# Patient Record
Sex: Female | Born: 1980 | Race: White | Hispanic: No | Marital: Married | Smoking: Never smoker
Health system: Southern US, Community
[De-identification: ages and names within clinical notes are randomized; demographics above are authoritative.]

## PROBLEM LIST (undated history)

## (undated) DIAGNOSIS — B019 Varicella without complication: Secondary | ICD-10-CM

## (undated) DIAGNOSIS — G43909 Migraine, unspecified, not intractable, without status migrainosus: Secondary | ICD-10-CM

## (undated) DIAGNOSIS — N390 Urinary tract infection, site not specified: Secondary | ICD-10-CM

## (undated) DIAGNOSIS — K5900 Constipation, unspecified: Secondary | ICD-10-CM

## (undated) DIAGNOSIS — J0191 Acute recurrent sinusitis, unspecified: Secondary | ICD-10-CM

## (undated) DIAGNOSIS — T7840XA Allergy, unspecified, initial encounter: Secondary | ICD-10-CM

## (undated) DIAGNOSIS — H6691 Otitis media, unspecified, right ear: Secondary | ICD-10-CM

## (undated) DIAGNOSIS — J40 Bronchitis, not specified as acute or chronic: Secondary | ICD-10-CM

## (undated) DIAGNOSIS — N200 Calculus of kidney: Secondary | ICD-10-CM

## (undated) DIAGNOSIS — Z0000 Encounter for general adult medical examination without abnormal findings: Secondary | ICD-10-CM

## (undated) HISTORY — DX: Urinary tract infection, site not specified: N39.0

## (undated) HISTORY — DX: Varicella without complication: B01.9

## (undated) HISTORY — DX: Otitis media, unspecified, right ear: H66.91

## (undated) HISTORY — DX: Acute recurrent sinusitis, unspecified: J01.91

## (undated) HISTORY — DX: Bronchitis, not specified as acute or chronic: J40

## (undated) HISTORY — DX: Calculus of kidney: N20.0

## (undated) HISTORY — DX: Allergy, unspecified, initial encounter: T78.40XA

## (undated) HISTORY — PX: OTHER SURGICAL HISTORY: SHX169

## (undated) HISTORY — DX: Encounter for general adult medical examination without abnormal findings: Z00.00

## (undated) HISTORY — DX: Migraine, unspecified, not intractable, without status migrainosus: G43.909

## (undated) HISTORY — DX: Constipation, unspecified: K59.00

---

## 2001-04-14 ENCOUNTER — Other Ambulatory Visit: Admission: RE | Admit: 2001-04-14 | Discharge: 2001-04-14 | Payer: Self-pay | Admitting: Family Medicine

## 2002-04-21 ENCOUNTER — Other Ambulatory Visit: Admission: RE | Admit: 2002-04-21 | Discharge: 2002-04-21 | Payer: Self-pay | Admitting: Family Medicine

## 2002-12-12 ENCOUNTER — Other Ambulatory Visit: Admission: RE | Admit: 2002-12-12 | Discharge: 2002-12-12 | Payer: Self-pay | Admitting: Obstetrics and Gynecology

## 2003-06-24 ENCOUNTER — Inpatient Hospital Stay (HOSPITAL_COMMUNITY): Admission: AD | Admit: 2003-06-24 | Discharge: 2003-06-26 | Payer: Self-pay | Admitting: Obstetrics and Gynecology

## 2003-08-07 ENCOUNTER — Other Ambulatory Visit: Admission: RE | Admit: 2003-08-07 | Discharge: 2003-08-07 | Payer: Self-pay | Admitting: Obstetrics and Gynecology

## 2004-06-05 ENCOUNTER — Inpatient Hospital Stay (HOSPITAL_COMMUNITY): Admission: AD | Admit: 2004-06-05 | Discharge: 2004-06-07 | Payer: Self-pay | Admitting: Obstetrics and Gynecology

## 2004-07-03 ENCOUNTER — Encounter: Payer: Self-pay | Admitting: Family Medicine

## 2004-07-03 LAB — CONVERTED CEMR LAB

## 2004-07-10 ENCOUNTER — Other Ambulatory Visit: Admission: RE | Admit: 2004-07-10 | Discharge: 2004-07-10 | Payer: Self-pay | Admitting: Obstetrics and Gynecology

## 2005-04-24 ENCOUNTER — Ambulatory Visit: Payer: Self-pay | Admitting: Family Medicine

## 2005-05-25 ENCOUNTER — Ambulatory Visit: Payer: Self-pay | Admitting: Family Medicine

## 2005-07-06 ENCOUNTER — Ambulatory Visit: Payer: Self-pay | Admitting: Family Medicine

## 2005-07-20 ENCOUNTER — Ambulatory Visit: Payer: Self-pay | Admitting: Family Medicine

## 2005-11-10 DIAGNOSIS — G43909 Migraine, unspecified, not intractable, without status migrainosus: Secondary | ICD-10-CM | POA: Insufficient documentation

## 2005-11-10 DIAGNOSIS — J309 Allergic rhinitis, unspecified: Secondary | ICD-10-CM | POA: Insufficient documentation

## 2005-12-18 ENCOUNTER — Encounter: Payer: Self-pay | Admitting: Family Medicine

## 2007-02-10 ENCOUNTER — Ambulatory Visit: Payer: Self-pay | Admitting: Family Medicine

## 2007-02-10 DIAGNOSIS — R002 Palpitations: Secondary | ICD-10-CM | POA: Insufficient documentation

## 2007-02-10 DIAGNOSIS — L2089 Other atopic dermatitis: Secondary | ICD-10-CM | POA: Insufficient documentation

## 2007-02-11 ENCOUNTER — Encounter: Payer: Self-pay | Admitting: Family Medicine

## 2007-02-11 LAB — CONVERTED CEMR LAB
HCT: 41.7 % (ref 36.0–46.0)
MCV: 91.9 fL (ref 78.0–100.0)
RBC: 4.54 M/uL (ref 3.87–5.11)
WBC: 8.3 10*3/uL (ref 4.0–10.5)

## 2007-02-23 ENCOUNTER — Ambulatory Visit: Payer: Self-pay | Admitting: Cardiology

## 2007-03-01 ENCOUNTER — Encounter: Payer: Self-pay | Admitting: Cardiology

## 2007-03-01 ENCOUNTER — Ambulatory Visit: Payer: Self-pay

## 2007-03-01 ENCOUNTER — Ambulatory Visit: Payer: Self-pay | Admitting: Cardiology

## 2007-03-16 ENCOUNTER — Ambulatory Visit: Payer: Self-pay | Admitting: Cardiology

## 2007-07-18 ENCOUNTER — Ambulatory Visit: Payer: Self-pay | Admitting: Family Medicine

## 2007-07-18 DIAGNOSIS — H6590 Unspecified nonsuppurative otitis media, unspecified ear: Secondary | ICD-10-CM | POA: Insufficient documentation

## 2008-02-06 ENCOUNTER — Ambulatory Visit: Payer: Self-pay | Admitting: Family Medicine

## 2008-02-06 DIAGNOSIS — J0110 Acute frontal sinusitis, unspecified: Secondary | ICD-10-CM | POA: Insufficient documentation

## 2009-01-07 ENCOUNTER — Ambulatory Visit: Payer: Self-pay | Admitting: Family Medicine

## 2009-06-14 ENCOUNTER — Ambulatory Visit: Payer: Self-pay | Admitting: Family Medicine

## 2009-06-14 DIAGNOSIS — J019 Acute sinusitis, unspecified: Secondary | ICD-10-CM

## 2009-06-14 DIAGNOSIS — M7750 Other enthesopathy of unspecified foot: Secondary | ICD-10-CM | POA: Insufficient documentation

## 2009-06-20 ENCOUNTER — Encounter: Payer: Self-pay | Admitting: Family Medicine

## 2009-10-11 ENCOUNTER — Ambulatory Visit: Payer: Self-pay | Admitting: Family Medicine

## 2009-10-11 DIAGNOSIS — J029 Acute pharyngitis, unspecified: Secondary | ICD-10-CM

## 2009-10-22 ENCOUNTER — Telehealth: Payer: Self-pay | Admitting: Family Medicine

## 2010-02-17 ENCOUNTER — Ambulatory Visit
Admission: RE | Admit: 2010-02-17 | Discharge: 2010-02-17 | Payer: Self-pay | Source: Home / Self Care | Attending: Family Medicine | Admitting: Family Medicine

## 2010-02-17 ENCOUNTER — Encounter
Admission: RE | Admit: 2010-02-17 | Discharge: 2010-02-17 | Payer: Self-pay | Source: Home / Self Care | Attending: Family Medicine | Admitting: Family Medicine

## 2010-02-17 DIAGNOSIS — J329 Chronic sinusitis, unspecified: Secondary | ICD-10-CM | POA: Insufficient documentation

## 2010-02-26 ENCOUNTER — Encounter: Payer: Self-pay | Admitting: Family Medicine

## 2010-03-04 NOTE — Progress Notes (Signed)
Summary: Still sick  Phone Note Call from Patient Call back at Home Phone [phone]   Caller: Patient Call For: Seymour Bars DO Summary of Call: Pt called and states she is not any better after taking the med you gave her and wanted to know if could get different med sent in. Still with same symptoms and feels worse Initial call taken by: Kathlene November,  October 22, 2009 1:31 PM  Follow-up for Phone Call        Will try 5 days of Zithromax along with anti histamine - decongestant that she was previously taking.  If not improved in 7 days, will need CT of the sinuses. Follow-up by: Seymour Bars DO,  October 22, 2009 1:38 PM    New/Updated Medications: ZITHROMAX Z-PAK 250 MG TABS (AZITHROMYCIN) take as directed x 5 days Prescriptions: ZITHROMAX Z-PAK 250 MG TABS (AZITHROMYCIN) take as directed x 5 days  #1 pack x 0   Entered and Authorized by:   Seymour Bars DO   Signed by:   Seymour Bars DO on 10/22/2009   Method used:   [facility] to        [facility] [phone][facility] (retail)       [address] [place], [place]  [address]       Ph: [phone] or [phone]       Fax: [phone]   RxID:   [id number]   Appended Document: Still sick 10/22/2009 @ 1:47pm- Pt notified of MD instructions.KJ LPN

## 2010-03-04 NOTE — Assessment & Plan Note (Signed)
Summary: metatarsalgia/ sinusitis   Vital Signs:  Patient profile:   30 year old female Height:      62 inches Weight:      158 pounds BMI:     29.00 O2 Sat:      100 % on Room air Pulse rate:   76 / minute BP sitting:   125 / 76  (left arm) Cuff size:   regular  Vitals Entered By: Payton Spark CMA (Jun 14, 2009 10:22 AM)  O2 Flow:  Room air CC: R foot pain under toes x 3 weeks. No known injury. Also c/o sinus infection/allergies x 2 weeks.    Primary Care Provider:  Seymour Bars DO  CC:  R foot pain under toes x 3 weeks. No known injury. Also c/o sinus infection/allergies x 2 weeks. Marland Kitchen  History of Present Illness: 30 yo WF presents for R foot pain that started Easter Sunday w/o trauma.  She has been having pain under the lateral arch.    She is also having allergies.  She is taking Benadryl but this is not helping.  She has been congested x 2 wks.  She is not blowing much out.  She is having ear pressure and HAs.  No fevers or chills.  She feels really tired.  No GI upset.  No cough or sore throat.    Current Medications (verified): 1)  Maxalt 10 Mg Tabs (Rizatriptan Benzoate) .Marland Kitchen.. 1 Tab By Mouth X 1 As Needed Migraine, Ok To Repeat in 2 Hrs If Needed  Allergies (verified): 1)  ! Pcn  Past History:  Past Medical History: G2P2  Mirena IUD 2006 migraines allergies  Past Surgical History: Reviewed history from 12/18/2005 and no changes required. Tonsillectomy  tympanostomy tubes  Social History: Reviewed history from 01/07/2009 and no changes required. 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  Married to [place].  Nonsmoker.  Has college degree.  No regular exercise.  substitute teaches  Review of Systems      See HPI  Physical Exam  General:  alert, well-developed, well-nourished, and well-hydrated.   Head:  normocephalic and atraumatic.  frontal sinus TTP Eyes:  conjunctiva clear Ears:  bilat TM scaring. R>L sided white layered out effusion Nose:  nasal  congestion with boggy turbinates Mouth:  o/p pink and moist with good dentition and injection Neck:  no masses.   Lungs:  Normal respiratory effort, chest expands symmetrically. Lungs are clear to auscultation, no crackles or wheezes. Heart:  Normal rate and regular rhythm. S1 and S2 normal without gallop, murmur, click, rub or other extra sounds. Extremities:  tender over metatarsal heads R foot on toes 2-4.  with normal foot anatomy; 2+ pedal pulses.  No skin changes.   Neurologic:  Limping toward the L side Skin:  color normal.     Impression & Recommendations:  Problem # 1:  ACUTE SINUSITIS, UNSPECIFIED (ICD-461.9) Treat with Zithromax, Claritin D (for underlying allergies and head congestion) and Advil.  Call if not improved in 10 days. The following medications were removed from the medication list:    Zithromax Z-pak 250 Mg Tabs (Azithromycin) .Marland Kitchen... 2 tabs by mouth x 1 day then 1 tab by mouth daily x 4 days Her updated medication list for this problem includes:    Claritin-d 24 Hour 10-240 Mg Xr24h-tab (Loratadine-pseudoephedrine) .Marland Kitchen... 1 tab by mouth daily    Zithromax Z-pak 250 Mg Tabs (Azithromycin) .Marland Kitchen... 2 tabs by mouth x 1 day then 1 tab by mouth  daily x 4 days  Problem # 2:  METATARSALGIA (ICD-726.70) Treat acute metatarsalgia with RICE.  H/o given.  Elevate foot x 24 hrs, using ice massage, ace wrap and Advil.  Follow with use of gel insoles in shoes, avoid high heals.  Refer to podiatry assuming it is not improving for u/s treatment and orthotics. Orders: Podiatry Referral (Podiatry)  Complete Medication List: 1)  Maxalt 10 Mg Tabs (Rizatriptan benzoate) .Marland Kitchen.. 1 tab by mouth x 1 as needed migraine, ok to repeat in 2 hrs if needed 2)  Claritin-d 24 Hour 10-240 Mg Xr24h-tab (Loratadine-pseudoephedrine) .Marland Kitchen.. 1 tab by mouth daily 3)  Zithromax Z-pak 250 Mg Tabs (Azithromycin) .... 2 tabs by mouth x 1 day then 1 tab by mouth daily x 4 days  Patient Instructions: 1)  Take 5  days of Zithromax for sinusitis. 2)  Use Claritin D once a day for allergies/ congestion and Advil as needed for aches/ pains. 3)  Call if not clearing in 10 days. 4)  For metatarsalgia, ace wrap R foot, elevate and stay off of it for 24 hrs.  Use ice massage on and off 10 min/ time.  Use Gel insoles in shoes.  I will refer to podiatry re: ultrasound treatment and insoles.   Prescriptions: ZITHROMAX Z-PAK 250 MG TABS (AZITHROMYCIN) 2 tabs by mouth x 1 day then 1 tab by mouth daily x 4 days  #1 pack x 0   Entered and Authorized by:   Seymour Bars DO   Signed by:   Seymour Bars DO on 06/14/2009   Method used:   [facility] to        [facility] [phone][facility] (retail)       [address] [place], [place]  [address]       Ph: [phone] or [phone]       Fax: [phone]   RxID:   [id number]

## 2010-03-04 NOTE — Consult Note (Signed)
Summary: [facility]  [facility]   Imported By: Lanelle Bal 07/04/2009 10:17:34  _____________________________________________________________________  External Attachment:    Type:   Image     Comment:   External Document

## 2010-03-04 NOTE — Assessment & Plan Note (Signed)
Summary: sinusitis   Vital Signs:  Patient profile:   30 year old female Height:      62 inches Weight:      155 pounds BMI:     28.45 O2 Sat:      100 % on Room air Temp:     98.3 degrees F oral Pulse rate:   62 / minute BP sitting:   119 / 78  (left arm) Cuff size:   regular  Vitals Entered By: Payton Spark CMA (October 11, 2009 9:29 AM)  O2 Flow:  Room air CC: Ear pain, cough, congestion, ST, facial pressure. Also c/o more frequent HAs.   Primary Care Provider:  Seymour Bars DO  CC:  Ear pain, cough, congestion, ST, and facial pressure. Also c/o more frequent HAs..  History of Present Illness: Roberta Wu is a 30 year-old female with a history of sinus infections.  Today the patient reports that she has been feeling a lot of sinus pressure that started about two weeks ago, but on Monday she began to have sinus drainage, a sore throat,  a non-productive cough and headaches.  She denies any fevers but endorses the chills on a couple of occasions.  She also endorses nausea and two episodes of vomitting on Wedsenday.  She denies any photophobia or recent migrane history or any abdominal pain.  In addition, she denies any dental pain.  Claritin D was helpful until this week when she noticed she was getting worse.  The patient also used cough drops to suppress her cough.    Current Medications (verified): 1)  Maxalt 10 Mg Tabs (Rizatriptan Benzoate) .Marland Kitchen.. 1 Tab By Mouth X 1 As Needed Migraine, Ok To Repeat in 2 Hrs If Needed 2)  Claritin-D 24 Hour 10-240 Mg Xr24h-Tab (Loratadine-Pseudoephedrine) .Marland Kitchen.. 1 Tab By Mouth Daily  Allergies (verified): 1)  ! Pcn  Past History:  Past Medical History: Reviewed history from 06/14/2009 and no changes required. G2P2  Mirena IUD 2006 migraines allergies  Past Surgical History: Reviewed history from 12/18/2005 and no changes required. Tonsillectomy  tympanostomy tubes  Social History: Reviewed history from 01/07/2009 and no  changes required. 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  Married to [place].  Nonsmoker.  Has college degree.  No regular exercise.  substitute teaches  Review of Systems      See HPI  Physical Exam  General:  alert, well-developed, and well-nourished.   Head:  Maxillary sinus sensitive to palpitation. Eyes:  PEERLA, Conjunctivae are clear and the sclrea are white Ears:  The tragus and pinna are both non-painful.  Canals are clear and the tympanic membrane is without inflammation.  There is scarring on the TM. Nose:  No nasal discharge noted.  Some modest erythema Mouth:  good dentition and no exudates.  Mild erythema and post-nasal drip appreciated Neck:  supple and no thyromegaly.   Lungs:  Clear to auscultation with no crackles, rales, rhonchi, or wheezes.  No egophany noted. Heart:  normal rate, regular rhythm, no murmur, no gallop, and no rub.   Skin:  color normal and no rashes.   Cervical Nodes:  shotty submandibular LA Psych:  normally interactive and not anxious appearing.     Impression & Recommendations:  Problem # 1:  ACUTE SINUSITIS, UNSPECIFIED (ICD-461.9)  Rapid STREP neg. Will treat for sinusitis given underlying allergies and 2 wks of head congestion and sinus pressure with Cefdinir x 10 days. Change Claritin D to Allegra D and sample of  Veramyst given to use for the next 2 wks. Call if not improving after 10 days. The following medications were removed from the medication list:    Zithromax Z-pak 250 Mg Tabs (Azithromycin) .Marland Kitchen... 2 tabs by mouth x 1 day then 1 tab by mouth daily x 4 days Her updated medication list for this problem includes:    Cefdinir 300 Mg Caps (Cefdinir) .Marland Kitchen... 1 capsule by mouth two times a day x 10 days    Veramyst 27.5 Mcg/spray Susp (Fluticasone furoate) .Marland Kitchen... 2 sprays per nostril daily    Allegra-d Allergy & Congestion 180-240 Mg Xr24h-tab (Fexofenadine-pseudoephedrine) .Marland Kitchen... 1 tab by mouth qam  Complete Medication List: 1)  Maxalt 10 Mg  Tabs (Rizatriptan benzoate) .Marland Kitchen.. 1 tab by mouth x 1 as needed migraine, ok to repeat in 2 hrs if needed 2)  Cefdinir 300 Mg Caps (Cefdinir) .Marland Kitchen.. 1 capsule by mouth two times a day x 10 days 3)  Veramyst 27.5 Mcg/spray Susp (Fluticasone furoate) .... 2 sprays per nostril daily 4)  Allegra-d Allergy & Congestion 180-240 Mg Xr24h-tab (Fexofenadine-pseudoephedrine) .Marland Kitchen.. 1 tab by mouth qam  Other Orders: Rapid Strep (16[address])  Patient Instructions: 1)  Take Cefdinir 2 x a day x 10 days for sinusitis. 2)  Change Claritin D to Allegra D (OTC) once daily for allergies. 3)  Add vermayst nasal spray 2 sprays per nostril once daily for the next 2 wks. 4)  Call if not improved in 10 days. Prescriptions: CEFDINIR 300 MG CAPS (CEFDINIR) 1 capsule by mouth two times a day x 10 days  #20 x 0   Entered and Authorized by:   Seymour Bars DO   Signed by:   Seymour Bars DO on 10/11/2009   Method used:   [facility]ronically to        [facility] [phone]* (retail)       [address] [place], [place]  [address]       Ph: [phone] or [phone]       Fax: [phone]   RxID:   [id number] MAXALT 10 MG TABS (RIZATRIPTAN BENZOATE) 1 tab by mouth x 1 as needed migraine, OK to repeat in 2 hrs if needed  #12 tabs x 6   Entered and Authorized by:   Seymour Bars DO   Signed by:   Seymour Bars DO on 10/11/2009   Method used:   Electronically to        [facility] [phone]* (retail)       [address] [place], [place]  [address]       Ph: [phone] or [phone]       Fax: [phone]   RxID:   [id number]   Laboratory Results    Other Tests  Rapid Strep: negative

## 2010-03-06 NOTE — Assessment & Plan Note (Signed)
Summary: recurrent sinusitis   Vital Signs:  Patient profile:   30 year old female Height:      62 inches Weight:      157 pounds BMI:     28.82 O2 Sat:      98 % on Room air Temp:     98.2 degrees F oral Pulse rate:   81 / minute BP sitting:   132 / 79  (left arm) Cuff size:   large  Vitals Entered By: Payton Spark CMA (February 17, 2010 10:49 AM)  O2 Flow:  Room air CC: Nasal congestion. ? Sinus infection x 1 week.    Primary Care Provider:  Seymour Bars DO  CC:  Nasal congestion. ? Sinus infection x 1 week. Marland Kitchen  History of Present Illness: 30 yo WF presents for continued nasal congestion.  She has ear pressure and popping, sore throat and months of nasal congestion with sinus pain, pressure and HAs.  She was last treated for a sinus infection in Sept but reports that within a few wks, she was congested again.  She has taken many different anti histamines, decongestants and nasal sprays which do not clear her up.  She has never been allergy tested.  She has some chest tightness, no much cough.  Denies SOB or wheezing.  She feels tired.  She has not been hungry from all the post nasal drip.  Denies F/C or wheezing.  She is not a smoker.  This has been her 4th sinus infection in the past year.    Current Medications (verified): 1)  Maxalt 10 Mg Tabs (Rizatriptan Benzoate) .Marland Kitchen.. 1 Tab By Mouth X 1 As Needed Migraine, Ok To Repeat in 2 Hrs If Needed  Allergies (verified): 1)  ! Pcn 2)  * Cefdinir  Past History:  Past Medical History: Reviewed history from 06/14/2009 and no changes required. G2P2  Mirena IUD 2006 migraines allergies  Past Surgical History: Reviewed history from 12/18/2005 and no changes required. Tonsillectomy  tympanostomy tubes  Social History: Reviewed history from 01/07/2009 and no changes required. 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  Nonsmoker.  Has college degree.  No regular exercise.  substitute teaches  Review of  Systems      See HPI  Physical Exam  General:  alert, well-developed, well-nourished, and well-hydrated.   Head:  normocephalic and atraumatic.  maxillary sinuses TTP Eyes:  conjunctiva clear; allergic shiners present Ears:  EACs patent; TMs translucent and gray with good cone of light and bony landmarks.  with scarring Nose:  nasal congestion present with boggy turbinates Mouth:  o/p mildly injected with clear postnasal drip Neck:  no masses.   Lungs:  Normal respiratory effort, chest expands symmetrically. Lungs are clear to auscultation, no crackles or wheezes. Heart:  Normal rate and regular rhythm. S1 and S2 normal without gallop, murmur, click, rub or other extra sounds. Skin:  color normal.   Cervical Nodes:  enlarged anterior cervical chain LNs   Impression & Recommendations:  Problem # 1:  SINUSITIS, RECURRENT (ICD-473.9) Since she has chronic nasal congestion despite anti histamine - decongestant combos + nasal steroid spray with lack of adeqate response to abx on 4 occasions in the past year, now with another sinus infection, will get CT sinuses today and treat current infection with Bactrim DS x 14 days, + Claritin D and Nasonex.  Time to get her in with ENT and think about allergy shots. The following medications were removed  from the medication list:    Cefdinir 300 Mg Caps (Cefdinir) .Marland Kitchen... 1 capsule by mouth two times a day x 10 days    Veramyst 27.5 Mcg/spray Susp (Fluticasone furoate) .Marland Kitchen... 2 sprays per nostril daily    Allegra-d Allergy & Congestion 180-240 Mg Xr24h-tab (Fexofenadine-pseudoephedrine) .Marland Kitchen... 1 tab by mouth qam    Zithromax Z-pak 250 Mg Tabs (Azithromycin) .Marland Kitchen... Take as directed x 5 days Her updated medication list for this problem includes:    Claritin-d 24 Hour 10-240 Mg Xr24h-tab (Loratadine-pseudoephedrine) .Marland Kitchen... 1 tab by mouth qam    Nasonex 50 Mcg/act Susp (Mometasone furoate) .Marland Kitchen... 2 sprays/ nostril once daily    Smz-tmp Ds 800-160 Mg Tabs  (Sulfamethoxazole-trimethoprim) .Marland Kitchen... 1 tab by mouth two times a day x 14 days  Orders: T-CT Maxillofacial Limited w/o [phone]) ENT Referral (ENT)  Complete Medication List: 1)  Maxalt 10 Mg Tabs (Rizatriptan benzoate) .Marland Kitchen.. 1 tab by mouth x 1 as needed migraine, ok to repeat in 2 hrs if needed 2)  Claritin-d 24 Hour 10-240 Mg Xr24h-tab (Loratadine-pseudoephedrine) .Marland Kitchen.. 1 tab by mouth qam 3)  Nasonex 50 Mcg/act Susp (Mometasone furoate) .... 2 sprays/ nostril once daily 4)  Smz-tmp Ds 800-160 Mg Tabs (Sulfamethoxazole-trimethoprim) .Marland Kitchen.. 1 tab by mouth two times a day x 14 days  Patient Instructions: 1)  Treat current sinus infection with Bactrim DS- 1 tab with breakfast and dinner for 2 wks. 2)  Will get CT sinuses today and I will call you w/ results tomorrow. 3)  ENT referral will be made for further evaluation. 4)  For underlying allergies, stay on Claritin D + Nasonex daily. 5)  Call me if any problems. Prescriptions: SMZ-TMP DS 800-160 MG TABS (SULFAMETHOXAZOLE-TRIMETHOPRIM) 1 tab by mouth two times a day x 14 days  #28 x 0   Entered and Authorized by:   Seymour Bars DO   Signed by:   Seymour Bars DO on 02/17/2010   Method used:   Electronically to        [facility] [phone]* (retail)       [address] [place], [place]  [address]       Ph: [phone] or [phone]       Fax: [phone]   RxID:   [id number] NASONEX 50 MCG/ACT SUSP (MOMETASONE FUROATE) 2 sprays/ nostril once daily  #1 bottle x 1   Entered and Authorized by:   Seymour Bars DO   Signed by:   Seymour Bars DO on 02/17/2010   Method used:   Electronically to        [facility] [phone]* (retail)       [address] [place], [place]  [address]       Ph: [phone] or [phone]       Fax: [phone]   RxID:   [id number]    Orders Added: 1)  T-CT Maxillofacial Limited w/o [73710] 2)  ENT Referral [ENT] 3)  Est. Patient Level III [62694]

## 2010-03-26 NOTE — Letter (Signed)
Summary: [facility]   Imported By: Kassie Mends 03/20/2010 08:46:24  _____________________________________________________________________  External Attachment:    Type:   Image     Comment:   External Document

## 2010-06-17 NOTE — Assessment & Plan Note (Signed)
[facility]                            CARDIOLOGY OFFICE NOTE   NAME:Roberta Wu, Roberta Wu                        MRN:          [id number]  DATE:03/16/2007                            DOB:          May 18, 1980    Roberta Wu is a 30 year old female whom I recently saw on February 23, 2007 secondary to dyspnea, chest tightness, and palpitations.  When I  saw her, we schedule her to have an echocardiogram which was performed  on March 01, 2007.  Her LV function was normal.  There were no  significant valvular abnormalities noted.  Her D-dimer was also normal.  She also had a monitor performed that showed sinus rhythm to sinus  tachycardia.  Since I saw her before, she continues to have some dyspnea  on exertion.  There is no orthopnea, PND, or pedal edema.  She also has  chest tightness.  This occurs predominantly at night with lying down.  It increases with inspiration, but it also improves with sitting up.  She particularly notices this on Sundays when she eats late at night.  Note there is no associated water brash.  Her palpitations have improved  since I saw her before.   MEDICATIONS:  None at present other than p.r.n. medications.   PHYSICAL EXAMINATION:  VITAL SIGNS:  Blood pressure of 127/79, and pulse  is 86.  She weighs 151 pounds.  HEENT:  Normal.  NECK:  Supple, with normal upstroke.  CHEST:  Clear.  CARDIOVASCULAR:  Regular rate and rhythm.  ABDOMEN:  Shows no tenderness.  EXTREMITIES:  Show no edema.   DIAGNOSES:  1. Chest tightness - Her symptoms do not sound cardiac.  They could      potentially be reflux as they occur predominantly at night and in      particular after large meals.  I have asked her to follow up with      Dr. Cathey Endow, and she may need a GI consult in the future.  She may      also require Protonix or Nexium.  2. Palpitations - These are much improved.  Her monitor showed sinus      rhythm to sinus tachycardia.  If they  worsen in the future, we can      consider a low-dose beta blocker.  3. Dyspnea - The etiology of this is unclear at present.  Note her D-      dimer was normal, and her LV function is normal.  She is not volume      overloaded on exam.  I have asked to follow up with Dr. Cathey Endow      concerning this issue.  4. History of migraines.   We will see her back on an as-needed basis.     Madolyn Frieze Jens Som, MD, [facility]  Electronically Signed    BSC/MedQ  DD: 03/16/2007  DT: 03/18/2007  Job #: [id number]   cc:   Seymour Bars, D.O.

## 2010-06-17 NOTE — Assessment & Plan Note (Signed)
[facility]                            CARDIOLOGY OFFICE NOTE   NAME:Calamia, NICKIA BOESEN                        MRN:          [id number]  DATE:02/23/2007                            DOB:          10/09/1980    Mrs. Roberta Wu is a very pleasant 30 year old female who presents for  evaluation of dyspnea and chest and palpitations.  She has no prior  cardiac history.  She typically does not have dyspnea on exertion,  orthopnea, PND, pedal edema, palpitations, pre-syncope, syncope or  exertional chest pain.  She does state that for the past several months  she has had occasional palpitations.  They are sudden in onset and  described as her heart racing.  There is no associated chest pain or  dyspnea.  These typically do not make her feel lightheaded, but she had  one approximately 2 weeks ago that was more sustained.  When she stood  to stand, she felt like she was going to pass out.  She was seen by  Dr. Cathey Endow and we were asked to further evaluate.  Note, she has also  noticed increased dyspnea on exertion recently.  There is no recent  travel or leg trauma. Note, she denies no orthopnea, PND or pedal edema.  She does not have exertional chest pain, but she can occasionally feel  tight in the chest.   CURRENT MEDICATIONS:  1. Allegra D as needed.  2. Maxalt as needed.  3. Triamcinolone cream.  She is not taking Allegra-D or Maxalt since seeing Dr. Cathey Endow.   SHE HAS AN ALLERGY TO PENICILLIN WHICH CAUSES A RASH.   FAMILY HISTORY:  Is negative for coronary artery disease, arrhythmia or  sudden death.   SOCIAL HISTORY:  She does not smoke nor is she consume alcohol.   PAST MEDICAL HISTORY:  There is no diabetes mellitus, hypertension or  hyperlipidemia.  She has had a previous tubes in her ears as well as  tonsillectomy.   REVIEW OF SYSTEMS:  She occasionally has migraines, but is not having  headaches at present.  There is no fevers, chills, productive  cough.  There is no hemoptysis.  There is no dysphagia, odynophagia, melena or  hematochezia. There is no dysuria, hematuria. There is no rash or  seizure activity. There is no orthopnea, PND or pedal edema.  Her  menstrual cycles are normal.  There is no claudication.  She has had  some pain in the right lower extremity at times.   PHYSICAL EXAMINATION:  Today shows a blood pressure of 129/82 and pulse  is 92.  She weighs 151 pounds.  She is well-developed, well-nourished.  She does not appear to be in any acute distress.  SKIN:  Warm and dry.  She does not appear to be depressed.  There is no peripheral clubbing.  Her back is normal.  HEENT:  Normal with normal eyelids.  NECK:  Supple with normal upstroke bilaterally.  No bruits noted.  There  is no jugular venous distention and no thyromegaly is noted.  Chest is clear to auscultation.  Normal expansion.  CARDIOVASCULAR:  Regular rate and rhythm.  Normal S1-S2.  There are no  murmurs, rubs or gallops noted.  There is no change Valsalva.  ABDOMEN: Exam nontender.  Positive bowel sounds.  No hepatosplenomegaly.  No mass appreciated.  There is no abdominal bruit.  She has 2+ femoral pulses bilaterally.  No bruits.  EXTREMITIES:  Show no edema.  I can palpate no cords.  She has a  negative Homans' bilaterally.  She has 2+ dorsalis pedis pulses  bilaterally.  NEUROLOGIC:  Examination grossly intact.   I do have electrocardiogram from February 10, 2007 taken Dr. Ovidio Kin  office.  She had a sinus rhythm.  The axis is normal.  There are no ST  changes.  There is no delta wave noted.  Her intervals are normal.   DIAGNOSES:  1. Palpitations - Etiology of this is unclear at present.      Electrocardiogram is normal.  Some of her symptoms sound like they      could be in SVT.  We will schedule her to have an event monitor to      more fully evaluate.  Also schedule her to have an echocardiogram      to quantify her left ventricular  function.  2. Dyspnea on exertion - She is not volume overloaded on exam.  I      doubt pulmonary embolus, but we will check a D-dimer to exclude.      The echocardiogram will help [place] quantify left ventricle function.  3. History of migraines - If her workup above is negative, then we      will resume her Maxalt.   I will see back in 4-6 weeks to review her event monitor.     Madolyn Frieze Jens Som, MD, [facility]  Electronically Signed    BSC/MedQ  DD: 02/23/2007  DT: 02/23/2007  Job #: [id number]   cc:   Seymour Bars, D.O.

## 2010-10-22 ENCOUNTER — Encounter: Payer: Self-pay | Admitting: Family Medicine

## 2010-10-22 ENCOUNTER — Ambulatory Visit (INDEPENDENT_AMBULATORY_CARE_PROVIDER_SITE_OTHER): Payer: 59 | Admitting: Family Medicine

## 2010-10-22 VITALS — BP 121/83 | HR 73 | Temp 98.2°F | Ht 62.0 in | Wt 152.0 lb

## 2010-10-22 DIAGNOSIS — J0191 Acute recurrent sinusitis, unspecified: Secondary | ICD-10-CM | POA: Insufficient documentation

## 2010-10-22 DIAGNOSIS — Z0000 Encounter for general adult medical examination without abnormal findings: Secondary | ICD-10-CM

## 2010-10-22 DIAGNOSIS — Z889 Allergy status to unspecified drugs, medicaments and biological substances status: Secondary | ICD-10-CM

## 2010-10-22 DIAGNOSIS — J019 Acute sinusitis, unspecified: Secondary | ICD-10-CM

## 2010-10-22 DIAGNOSIS — J329 Chronic sinusitis, unspecified: Secondary | ICD-10-CM

## 2010-10-22 DIAGNOSIS — J309 Allergic rhinitis, unspecified: Secondary | ICD-10-CM

## 2010-10-22 DIAGNOSIS — H6691 Otitis media, unspecified, right ear: Secondary | ICD-10-CM | POA: Insufficient documentation

## 2010-10-22 HISTORY — DX: Encounter for general adult medical examination without abnormal findings: Z00.00

## 2010-10-22 MED ORDER — CETIRIZINE HCL 10 MG PO TABS: ORAL_TABLET | ORAL | Status: DC

## 2010-10-22 MED ORDER — FLUTICASONE PROPIONATE 50 MCG/ACT NA SUSP: 2.0000 | Freq: Every day | NASAL | Status: DC

## 2010-10-22 MED ORDER — AZITHROMYCIN 250 MG PO TABS: ORAL_TABLET | ORAL | Status: AC

## 2010-10-22 MED ORDER — GUAIFENESIN ER 600 MG PO TB12: 600.0000 mg | ORAL_TABLET | Freq: Two times a day (BID) | ORAL | Status: DC

## 2010-10-22 NOTE — Patient Instructions (Signed)

## 2010-10-22 NOTE — Assessment & Plan Note (Signed)
Azithromycin, Mucinex and increase fluids, call if symptoms do not resolve

## 2010-10-22 NOTE — Assessment & Plan Note (Signed)
Restart Cetirizine 10 mg daily and may increase to bid on a bad day, start Fluticasone 1 spray each nostril daily as well. Consider allergy testing if symptoms worsen.

## 2010-10-22 NOTE — Assessment & Plan Note (Signed)
Patient declines flu shot today, declines labs soon for preventative health purposes now but agrees to return for annual exam and labs prior to that in 9-12 months or sooner as needed

## 2010-10-22 NOTE — Progress Notes (Signed)
Roberta Wu [id number] 01-20-1981 10/22/2010      Progress Note New Patient  Subjective  Chief Complaint  Chief Complaint  Patient presents with  . Establish Care    new patient  . Sinusitis    HPI  Patient is a 30 year old Caucasian female who is in today for an urgent inpatient appointment. She is a long history of otitis media dating back to childhood. As an adult she developed a recurrent allergies and sinusitis. Previously she was maintained on cetirizine and Nasonex and went quite some time without a sinus infection. Presently she is not taking either one and over the last week has been struggling with low-grade fevers and chills increased facial pressure, sore throat, dry cough, nasal congestion and right ear pain. She has a feeling like her left ear is clogged as well. She is presently working as a Runner, broadcasting/film/video and is uncomfortable enough to make work difficult. No chest pain, palpitations, shortness of breath, GI or GU complaints noted at today's visit.  Past Medical History  Diagnosis Date  . Chicken pox as a child  . Migraines   . Allergy   . Otitis media of right ear   . Recurrent acute sinusitis   . Preventative health care 10/22/2010    Past Surgical History  Procedure Date  . Tubes in ears   . Adnoids     Family History  Problem Relation Age of Onset  . Migraines Mother   . Migraines Brother   . Dementia Maternal Grandmother   . Hypertension Maternal Grandmother   . Hyperlipidemia Maternal Grandmother   . Cancer Paternal Grandfather     lymphoma/smoker  . Stroke Maternal Grandfather   . Depression Paternal Grandmother     History   Social History  . Marital Status: Married    Spouse Name: N/A    Number of Children: N/A  . Years of Education: N/A   Occupational History  . Not on file.   Social History Main Topics  . Smoking status: Never Smoker   . Smokeless tobacco: Never Used  . Alcohol Use: No  . Drug Use: No  . Sexually Active: Yes    Other Topics Concern  . Not on file   Social History Narrative  . No narrative on file    No current outpatient prescriptions on file prior to visit.    Allergies  Allergen Reactions  . Cefdinir     REACTION: GI SEs  . Latex   . Penicillins     REACTION: hives    Review of Systems  Review of Systems  Constitutional: Positive for fever and chills. Negative for malaise/fatigue.  HENT: Positive for ear pain, congestion and sore throat. Negative for hearing loss and nosebleeds.   Eyes: Negative for pain and discharge.  Respiratory: Positive for cough. Negative for sputum production, shortness of breath and wheezing.   Cardiovascular: Negative for chest pain, palpitations and leg swelling.  Gastrointestinal: Negative for heartburn, nausea, vomiting, abdominal pain, diarrhea, constipation and blood in stool.  Genitourinary: Negative for dysuria, urgency, frequency and hematuria.  Musculoskeletal: Negative for myalgias, back pain and falls.  Skin: Negative for rash.  Neurological: Negative for dizziness, tremors, sensory change, focal weakness, loss of consciousness, weakness and headaches.  Endo/Heme/Allergies: Negative for polydipsia. Does not bruise/bleed easily.  Psychiatric/Behavioral: Negative for depression and suicidal ideas. The patient is not nervous/anxious and does not have insomnia.     Objective  BP 121/83  Pulse 73  Temp(Src) 98.2 F (36.8  C) (Oral)  Ht 5\' 2"  (1.575 m)  Wt 152 lb (68.947 kg)  BMI 27.80 kg/m2  SpO2 99%  LMP 09/21/2010  Physical Exam  Physical Exam  Constitutional: She is oriented to person, place, and time and well-developed, well-nourished, and in no distress. No distress.  HENT:  Head: Normocephalic and atraumatic.  Right Ear: External ear normal.  Left Ear: External ear normal.  Nose: Nose normal.  Mouth/Throat: Oropharynx is clear and moist. No oropharyngeal exudate.       Scarring noted on b/l TMs, TMs dull not erythematous   Eyes: Conjunctivae are normal. Pupils are equal, round, and reactive to light. Right eye exhibits no discharge. Left eye exhibits no discharge. No scleral icterus.  Neck: Normal range of motion. Neck supple. No thyromegaly present.  Cardiovascular: Normal rate, regular rhythm, normal heart sounds and intact distal pulses.   No murmur heard. Pulmonary/Chest: Effort normal and breath sounds normal. No respiratory distress. She has no wheezes. She has no rales.  Abdominal: Soft. Bowel sounds are normal. She exhibits no distension and no mass. There is no tenderness.  Musculoskeletal: Normal range of motion. She exhibits no edema and no tenderness.  Lymphadenopathy:    She has no cervical adenopathy.  Neurological: She is alert and oriented to person, place, and time. She has normal reflexes. No cranial nerve deficit. Coordination normal.  Skin: Skin is warm and dry. No rash noted. She is not diaphoretic.  Psychiatric: Mood, memory and affect normal.       Assessment & Plan  Recurrent acute sinusitis Azithromycin, Mucinex and increase fluids, call if symptoms do not resolve  RHINITIS, ALLERGIC Restart Cetirizine 10 mg daily and may increase to bid on a bad day, start Fluticasone 1 spray each nostril daily as well. Consider allergy testing if symptoms worsen.  Preventative health care Patient declines flu shot today, declines labs soon for preventative health purposes now but agrees to return for annual exam and labs prior to that in 9-12 months or sooner as needed

## 2011-02-27 ENCOUNTER — Encounter: Payer: Self-pay | Admitting: Family Medicine

## 2011-02-27 ENCOUNTER — Ambulatory Visit (INDEPENDENT_AMBULATORY_CARE_PROVIDER_SITE_OTHER): Payer: 59 | Admitting: Family Medicine

## 2011-02-27 DIAGNOSIS — J40 Bronchitis, not specified as acute or chronic: Secondary | ICD-10-CM

## 2011-02-27 DIAGNOSIS — R05 Cough: Secondary | ICD-10-CM

## 2011-02-27 DIAGNOSIS — J329 Chronic sinusitis, unspecified: Secondary | ICD-10-CM

## 2011-02-27 DIAGNOSIS — J309 Allergic rhinitis, unspecified: Secondary | ICD-10-CM

## 2011-02-27 MED ORDER — ACETAMINOPHEN-CODEINE #3 300-30 MG PO TABS: 1.0000 | ORAL_TABLET | ORAL | Status: AC | PRN

## 2011-02-27 MED ORDER — AZITHROMYCIN 250 MG PO TABS: ORAL_TABLET | ORAL | Status: AC

## 2011-02-27 NOTE — Patient Instructions (Signed)

## 2011-03-01 ENCOUNTER — Encounter: Payer: Self-pay | Admitting: Family Medicine

## 2011-03-01 DIAGNOSIS — J40 Bronchitis, not specified as acute or chronic: Secondary | ICD-10-CM

## 2011-03-01 HISTORY — DX: Bronchitis, not specified as acute or chronic: J40

## 2011-03-01 NOTE — Assessment & Plan Note (Addendum)
Course of Azithromycin. Has to take Mucinex twice a day. Given Tylenol with codeine to use for cough suppression and pain. Push clear fluids and increase rest

## 2011-03-01 NOTE — Assessment & Plan Note (Signed)
Encouraged Fluticasone and antihistamines daily

## 2011-03-01 NOTE — Progress Notes (Signed)
Patient ID: Roberta Wu, female   DOB: 1980/10/25, 31 y.o.   MRN: [id number] SORAIDA VICKERS [id number] 1980/12/19 03/01/2011      Progress Note-Follow Up  Subjective  Chief Complaint  Chief Complaint  Patient presents with  . Cough    w/ phlegm (yellowish) until she throws up X 8 days  . Headache  . ears popping    HPI  Patient is a 31 year old Caucasian female who is in today 8 days worth of worsening cough productive of yellow phlegm. Assessment headache and pressure and popping in her ears. Some intermittent chest pain with coughing and occasional sensation of racing heart and even some shortness of breath with exertion. No obvious fevers and chills she has had some anorexia and fatigue. No GI or GU complaints. Cough is excessive enough to cause posttussive vomiting times per  Past Medical History  Diagnosis Date  . Chicken pox as a child  . Migraines   . Allergy   . Otitis media of right ear   . Recurrent acute sinusitis   . Preventative health care 10/22/2010  . Bronchitis 03/01/2011    Past Surgical History  Procedure Date  . Tubes in ears   . Adnoids     Family History  Problem Relation Age of Onset  . Migraines Mother   . Migraines Brother   . Dementia Maternal Grandmother   . Hypertension Maternal Grandmother   . Hyperlipidemia Maternal Grandmother   . Cancer Paternal Grandfather     lymphoma/smoker  . Stroke Maternal Grandfather   . Depression Paternal Grandmother     History   Social History  . Marital Status: Married    Spouse Name: N/A    Number of Children: N/A  . Years of Education: N/A   Occupational History  . Not on file.   Social History Main Topics  . Smoking status: Never Smoker   . Smokeless tobacco: Never Used  . Alcohol Use: No  . Drug Use: No  . Sexually Active: Yes   Other Topics Concern  . Not on file   Social History Narrative  . No narrative on file    Current Outpatient Prescriptions on File Prior to Visit    Medication Sig Dispense Refill  . cetirizine (ZYRTEC) 10 MG tablet 1 tab po daily and a second dose daily if symptoms flare  60 tablet  3  . fluticasone (FLONASE) 50 MCG/ACT nasal spray Place 2 sprays into the nose daily.  16 g  12  . guaiFENesin (MUCINEX) 600 MG 12 hr tablet Take 1 tablet (600 mg total) by mouth 2 (two) times daily.  20 tablet  1    Allergies  Allergen Reactions  . Cefdinir     REACTION: GI SEs  . Latex   . Penicillins     REACTION: hives    Review of Systems  Review of Systems  Constitutional: Positive for malaise/fatigue. Negative for fever and chills.  HENT: Positive for nosebleeds and congestion.   Eyes: Negative for discharge.  Respiratory: Positive for cough and shortness of breath.   Cardiovascular: Positive for chest pain and palpitations. Negative for leg swelling.  Gastrointestinal: Negative for nausea, abdominal pain, diarrhea and constipation.  Genitourinary: Negative for dysuria.  Musculoskeletal: Negative for falls.  Skin: Negative for rash.  Neurological: Negative for loss of consciousness and headaches.  Endo/Heme/Allergies: Negative for polydipsia.  Psychiatric/Behavioral: Negative for depression and suicidal ideas. The patient is not nervous/anxious and does not have insomnia.  Objective  BP 129/87  Pulse 70  Temp(Src) 98.8 F (37.1 C) (Temporal)  Ht 5\' 2"  (1.575 m)  Wt 154 lb 6.4 oz (70.035 kg)  BMI 28.24 kg/m2  SpO2 98%  LMP 02/06/2011  Physical Exam  Physical Exam  Constitutional: She is oriented to person, place, and time and well-developed, well-nourished, and in no distress. No distress.  HENT:  Head: Normocephalic and atraumatic.  Eyes: Conjunctivae are normal.  Neck: Neck supple. No thyromegaly present.  Cardiovascular: Normal rate, regular rhythm and normal heart sounds.   No murmur heard. Pulmonary/Chest: Effort normal. She has no wheezes. She has rales.       Slight, b/l bases  Abdominal: She exhibits no  distension and no mass.  Musculoskeletal: She exhibits no edema.  Lymphadenopathy:    She has no cervical adenopathy.  Neurological: She is alert and oriented to person, place, and time.  Skin: Skin is warm and dry. No rash noted. She is not diaphoretic.  Psychiatric: Memory, affect and judgment normal.    Lab Results  Component Value Date   TSH 1.388 02/11/2007   Lab Results  Component Value Date   WBC 8.3 02/11/2007   HGB 14.0 02/11/2007   HCT 41.7 02/11/2007   MCV 91.9 02/11/2007   PLT 312 02/11/2007      Assessment & Plan  Bronchitis Course of Azithromycin. Has to take Mucinex twice a day. Given Tylenol with codeine to use for cough suppression and pain. Push clear fluids and increase rest  RHINITIS, ALLERGIC Encouraged Fluticasone and antihistamines daily

## 2011-05-07 ENCOUNTER — Ambulatory Visit (INDEPENDENT_AMBULATORY_CARE_PROVIDER_SITE_OTHER): Payer: BC Managed Care – PPO | Admitting: Family Medicine

## 2011-05-07 ENCOUNTER — Encounter: Payer: Self-pay | Admitting: Family Medicine

## 2011-05-07 VITALS — BP 120/82 | HR 63 | Temp 99.3°F | Ht 62.0 in | Wt 159.0 lb

## 2011-05-07 DIAGNOSIS — J310 Chronic rhinitis: Secondary | ICD-10-CM

## 2011-05-07 DIAGNOSIS — J019 Acute sinusitis, unspecified: Secondary | ICD-10-CM | POA: Insufficient documentation

## 2011-05-07 MED ORDER — FEXOFENADINE HCL 180 MG PO TABS: 180.0000 mg | ORAL_TABLET | Freq: Every day | ORAL | Status: DC

## 2011-05-07 MED ORDER — DOXYCYCLINE HYCLATE 100 MG PO CAPS: 100.0000 mg | ORAL_CAPSULE | Freq: Two times a day (BID) | ORAL | Status: DC

## 2011-05-07 MED ORDER — FLUNISOLIDE 25 MCG/ACT (0.025%) NA SOLN: NASAL | Status: AC

## 2011-05-07 NOTE — Assessment & Plan Note (Signed)
Sx's not well controlled, seem to be year round, no change inside vs outside per pt. We'll switch from zyrtec to Allegra 180mg  qd and from flonase to flunisolide 2 sprays bid, encouraged bid nasal saline irrigation. Will do RAST allergy labs today.

## 2011-05-07 NOTE — Assessment & Plan Note (Signed)
Not clearly bacterial.  Her right ear appearance is such that I cannot rule out mild AOM, although there are extensive chronic changes on both TMs.  Will go ahead and do 10 day course of doxycycline 100mg  bid.

## 2011-05-07 NOTE — Progress Notes (Signed)
OFFICE NOTE  05/07/2011  CC:  Chief Complaint  Patient presents with  . Sinusitis    since Friday     HPI: Patient is a 31 y.o. Caucasian female who is here for resp illness. Pt presents complaining of respiratory symptoms for 6  days.  Mostly nasal congestion/runny nose, sneezing, and PND cough, some HA.  Worst symptoms seems to be the nose stuffy/runny--thick.  Lately the symptoms seem to be worsening--ears hurting/stopped up. No fevers, no wheezing, and no SOB.  No pain in face or teeth.  No significant HA.  ST mild at most.  Symptoms made worse by nothing.  Symptoms improved by nothing. Smoker? no Recent sick contact? no Muscle or joint aches? no Flu shot this season at least 2 wks ago? no  ROS: no v/d or abdominal pain, but nausea the first 3 days (related to migraine).  No rash.  No neck stiffness.   +Mild fatigue.  +Mild appetite loss.   Pertinent PMH:  Past Medical History  Diagnosis Date  . Chicken pox as a child  . Migraines   . Allergy   . Otitis media of right ear   . Recurrent acute sinusitis   . Preventative health care 10/22/2010  . Bronchitis 03/01/2011  Has never been allergy tested  MEDS:  Outpatient Prescriptions Prior to Visit  Medication Sig Dispense Refill  . cetirizine (ZYRTEC) 10 MG tablet 1 tab po daily and a second dose daily if symptoms flare  60 tablet  3  . fluticasone (FLONASE) 50 MCG/ACT nasal spray Place 2 sprays into the nose daily.  16 g  12  . guaiFENesin (MUCINEX) 600 MG 12 hr tablet Take 1 tablet (600 mg total) by mouth 2 (two) times daily.  20 tablet  1    PE: Blood pressure 120/82, pulse 63, temperature 99.3 F (37.4 C), temperature source Temporal, height 5\' 2"  (1.575 m), weight 159 lb (72.122 kg). VS: noted--normal. Gen: alert, NAD, NONTOXIC APPEARING. HEENT: eyes without injection, drainage, or swelling.  Ears: EACs clear, TMs with normal light reflex and landmarks.  Nose: Clear rhinorrhea, with some dried, crusty exudate  adherent to mildly injected mucosa.  No purulent d/c.  No paranasal sinus TTP.  No facial swelling.  Throat and mouth without focal lesion.  No pharyngial swelling, erythema, or exudate.   Neck: supple, no LAD.   LUNGS: CTA bilat, nonlabored resps.   CV: RRR, no m/r/g. EXT: no c/c/e SKIN: no rash    IMPRESSION AND PLAN:  Chronic rhinitis Sx's not well controlled, seem to be year round, no change inside vs outside per pt. We'll switch from zyrtec to Allegra 180mg  qd and from flonase to flunisolide 2 sprays bid, encouraged bid nasal saline irrigation. Will do RAST allergy labs today.  Sinusitis acute Not clearly bacterial.  Her right ear appearance is such that I cannot rule out mild AOM, although there are extensive chronic changes on both TMs.  Will go ahead and do 10 day course of doxycycline 100mg  bid.      FOLLOW UP: 3 wks

## 2011-05-08 LAB — ALLERGEN FOOD PROFILE SPECIFIC IGE
Apple: <0.1 kU/L (ref <0.35)
Chicken IgE: <0.1 kU/L (ref <0.35)
Corn: <0.1 kU/L (ref <0.35)
Fish Cod: <0.1 kU/L (ref <0.35)
IgE (Immunoglobulin E), Serum: 3 IU/mL (ref 0.0–180.0)
Milk IgE: <0.1 kU/L (ref <0.35)
Tuna IgE: <0.1 kU/L (ref <0.35)
Wheat IgE: <0.1 kU/L (ref <0.35)

## 2011-05-08 LAB — ~~LOC~~ ALLERGY PANEL
Allergen, Comm Silver Birch, t9: <0.1 kU/L (ref <0.35)
Allergen, D pternoyssinus,d7: 0.46 kU/L — ABNORMAL HIGH (ref <0.35)
Allergen, Mulberry, t76: <0.1 kU/l (ref <0.35)
Aspergillus fumigatus, IgG: <0.1 kU/L (ref <0.35)
Bahia Grass: <0.1 kU/L (ref <0.35)
Cladosporium Herbarum: <0.1 kU/L (ref <0.35)
Cockroach: <0.1 kU/L (ref <0.35)
Dog Dander: <0.1 kU/L (ref <0.35)
Elm IgE: <0.1 kU/L (ref <0.35)
Johnson Grass: <0.1 kU/L (ref <0.35)
Mugwort: <0.1 kU/L (ref <0.35)
Oak: <0.1 kU/L (ref <0.35)
Pecan/Hickory Tree IgE: <0.1 kU/L (ref <0.35)
Penicillium Notatum: <0.1 kU/L (ref <0.35)
Sweet Gum: <0.1 kU/L (ref <0.35)

## 2011-05-11 NOTE — Patient Instructions (Signed)
Indoor Allergies House dust often contains a mixture of tiny particles that commonly cause allergic symptoms. These include dust mites, cockroaches, fungi spores (mold) and animal dander.  DUST MITES Dust mites are so tiny that they cannot be seen with the naked eye (microscopic). They are relatives of the spider. They live on mattresses, pillows, bedding, upholstered furniture, carpets and curtains. These tiny creatures feed on skin flakes that people and pets shed daily. They commonly float around in the dust in your home when vacuuming or when bedding is disturbed. The air-born dust mites often cause runny noses and symptoms of asthma. The problems are similar to a pollen allergy. These mites thrive in summer and die in winter. In a warm, humid house, however, they continue to thrive even in the coldest months. The particles seen floating in a shaft of sunlight include dead dust mites and their waste-products. These waste-products, which are proteins, cause the allergic reaction. Even in the cleanest home, dust mites still exist. This is because typical cleaning methods cannot eliminate many of the dust particles.  COCKROACHES Cockroach allergy is primarily caused by their droppings. It is found in house dust, especially in older homes. MOLD Mold is very often found in homes and house dust, and when in high concentrations may become harmful, especially for people allergic to mold. They tend to grow faster in the presence of moisture. ANIMAL DANDER Pets (furred animals) can cause allergies too. This is not caused by the fur, but from the proteins in their skin, saliva and urine. These proteins are called allergens. The dander (skin scales) is the source of most pet allergies. Therefore, short-haired animals can cause allergies as much as Soil scientist. Dander and saliva are the source of cat and dog allergens. Urine is the source of allergens from rabbits, hamsters, mice and [place]  pigs. PREVENTION Dust mites  Use a dehumidifier or air conditioner to keep the humidity low (50% or below).   Cover your mattress and pillows in dust-proof or allergen resistant covers.   Wash all bedding and blankets once a week in hot water (at least 130 - 140F). Non-washable bedding can be frozen overnight to kill dust mites.   Replace wool or feathered bedding with synthetic materials and traditional stuffed animals with washable ones.   If possible, replace wall-to-wall carpets in bedrooms with bare floors (linoleum, tile or wood). Remove fabric curtains and upholstered furniture.   Use a damp mop or rag to remove dust. Do not use a dry cloth since this stirs up mite allergens.   Use a vacuum cleaner with either a double-layered micro filter bag or a HEPA filter. These filters trap allergens that pass through a vacuum's exhaust.   Wear a mask while vacuuming to avoid inhaling allergens. Stay out of the vacuumed area for 20 minutes while dust and allergens settle.   Use only high efficiency media filters for your furnace and air-conditioning, preferably with a MERV rating of 11 or 12. In order to maintain a clean filter, remember to change it at least every three months.  Cockroaches  Control cockroaches by eliminating their entrance to the home and by eliminating their food sources.   Block crevices and cracks and remove water sources such as leaky faucets and pipes.   Keep food out of the open when finished eating. This also includes pet food. Sealed containers for food work well. Remove crumbs that may have accumulated such as in a toaster.   Use garbage containers that  have lids and immediately clean off counters, tables and stove tops.   An exterminator might be helpful as well.  Mold  Control mold by eliminating moisture and dampness.   Repair leaks around the home including the roof and pipes.   For high humid areas consider using dehumidifiers. Rooms with the most  moisture include kitchens, bathrooms and basements.   Ventilation and cleaning are also important.   Detergent or 5% bleach can be used to clean off mold from hard surfaces. It is important not to mix bleach with other products and to dry the area completely after cleaning.   For more extensive mold problems hire an Gaffer.   For mold on clothing, soap and water work best. If they cannot be cleaned throw them out.  Animal dander  Control pet dander by removing pets from your home. If this is not an option then try lessen the contact by keeping the pet out of areas that you spend most of your time, such as the bedroom.   Vacuum often and consider replacing carpet with a hardwood floor, tile or linoleum.   A HEPA air cleaner may also help to reduce the level of animal allergen in the air.  Document Released: 12/21/2003 Document Revised: 01/08/2011 Document Reviewed: 05/01/2008 [facility] Patient Information 2012 [place], [place].

## 2011-05-15 ENCOUNTER — Ambulatory Visit (INDEPENDENT_AMBULATORY_CARE_PROVIDER_SITE_OTHER): Payer: BC Managed Care – PPO | Admitting: Family Medicine

## 2011-05-15 ENCOUNTER — Encounter: Payer: Self-pay | Admitting: Family Medicine

## 2011-05-15 VITALS — BP 127/86 | HR 59 | Temp 99.6°F | Ht 62.0 in | Wt 160.0 lb

## 2011-05-15 DIAGNOSIS — H6590 Unspecified nonsuppurative otitis media, unspecified ear: Secondary | ICD-10-CM

## 2011-05-15 DIAGNOSIS — J0191 Acute recurrent sinusitis, unspecified: Secondary | ICD-10-CM

## 2011-05-15 DIAGNOSIS — J309 Allergic rhinitis, unspecified: Secondary | ICD-10-CM

## 2011-05-15 DIAGNOSIS — J019 Acute sinusitis, unspecified: Secondary | ICD-10-CM

## 2011-05-15 DIAGNOSIS — H6690 Otitis media, unspecified, unspecified ear: Secondary | ICD-10-CM

## 2011-05-15 DIAGNOSIS — H6691 Otitis media, unspecified, right ear: Secondary | ICD-10-CM

## 2011-05-15 MED ORDER — SULFAMETHOXAZOLE-TRIMETHOPRIM 800-160 MG PO TABS: 1.0000 | ORAL_TABLET | Freq: Two times a day (BID) | ORAL | Status: AC

## 2011-05-15 MED ORDER — GUAIFENESIN ER 600 MG PO TB12: 600.0000 mg | ORAL_TABLET | Freq: Two times a day (BID) | ORAL | Status: DC

## 2011-05-15 NOTE — Patient Instructions (Signed)
Sinusitis Sinuses are air pockets within the bones of your face. The growth of bacteria within a sinus leads to infection. The infection prevents the sinuses from draining. This infection is called sinusitis. SYMPTOMS  There will be different areas of pain depending on which sinuses have become infected.  The maxillary sinuses often produce pain beneath the eyes.   Frontal sinusitis may cause pain in the middle of the forehead and above the eyes.  Other problems (symptoms) include:  Toothaches.   Colored, pus-like (purulent) drainage from the nose.   Swelling, warmth, and tenderness over the sinus areas may be signs of infection.  TREATMENT  Sinusitis is most often determined by an exam.X-rays may be taken. If x-rays have been taken, make sure you obtain your results or find out how you are to obtain them. Your caregiver may give you medications (antibiotics). These are medications that will help kill the bacteria causing the infection. You may also be given a medication (decongestant) that helps to reduce sinus swelling.  HOME CARE INSTRUCTIONS   Only take over-the-counter or prescription medicines for pain, discomfort, or fever as directed by your caregiver.   Drink extra fluids. Fluids help thin the mucus so your sinuses can drain more easily.   Applying either moist heat or ice packs to the sinus areas may help relieve discomfort.   Use saline nasal sprays to help moisten your sinuses. The sprays can be found at your local drugstore.  SEEK IMMEDIATE MEDICAL CARE IF:  You have a fever.   You have increasing pain, severe headaches, or toothache.   You have nausea, vomiting, or drowsiness.   You develop unusual swelling around the face or trouble seeing.  MAKE SURE YOU:   Understand these instructions.   Will watch your condition.   Will get help right away if you are not doing well or get worse.  Document Released: 01/19/2005 Document Revised: 01/08/2011 Document Reviewed:  08/18/2006 [facility] Patient Information 2012 [place], [place]. Increase fluids, rest, use ginger for nausea  Start a probiotic

## 2011-05-17 NOTE — Assessment & Plan Note (Signed)
Presistent, recommended gentle valsava maneuver

## 2011-05-17 NOTE — Progress Notes (Signed)
Patient ID: Roberta Wu, female   DOB: 1980-07-19, 31 y.o.   MRN: [id number] Roberta Wu [id number] 1980-05-09 05/17/2011      Progress Note-Follow Up  Subjective  Chief Complaint  Chief Complaint  Patient presents with  . Otitis Media    not feeling any better- left ear is clogged right ear will pop    HPI  Patient is a 31 year old Caucasian female who is in today with persistent ear and respiratory symptoms. She reports over the last 2 weeks struggling with intermittent headache, ear popping and congestion. She reports the left ear feels completely plugged and uncomfortable. She notes congestion and throat pain. She notes some anorexia and nausea but denies vomiting or diarrhea. No obvious high-grade fevers but some possible low-grade fevers and chills. Also noting some fatigue and generalized malaise.  Did tolerate the Doxycycline but is concerned she had an incomplete response.  Past Medical History  Diagnosis Date  . Chicken pox as a child  . Migraines   . Allergy   . Otitis media of right ear   . Recurrent acute sinusitis   . Preventative health care 10/22/2010  . Bronchitis 03/01/2011    Past Surgical History  Procedure Date  . Tubes in ears   . Adnoids     Family History  Problem Relation Age of Onset  . Migraines Mother   . Migraines Brother   . Dementia Maternal Grandmother   . Hypertension Maternal Grandmother   . Hyperlipidemia Maternal Grandmother   . Cancer Paternal Grandfather     lymphoma/smoker  . Stroke Maternal Grandfather   . Depression Paternal Grandmother     History   Social History  . Marital Status: Married    Spouse Name: N/A    Number of Children: N/A  . Years of Education: N/A   Occupational History  . Not on file.   Social History Main Topics  . Smoking status: Never Smoker   . Smokeless tobacco: Never Used  . Alcohol Use: No  . Drug Use: No  . Sexually Active: Yes   Other Topics Concern  . Not on file   Social History  Narrative  . No narrative on file    Current Outpatient Prescriptions on File Prior to Visit  Medication Sig Dispense Refill  . fexofenadine (ALLEGRA) 180 MG tablet Take 1 tablet (180 mg total) by mouth daily.  30 tablet  11  . flunisolide (NASALIDE) 0.025 % SOLN 2 sprays in each nostril twice daily  1 Bottle  11    Allergies  Allergen Reactions  . Cefdinir     REACTION: GI SEs  . Latex   . Penicillins     REACTION: hives    Review of Systems  Review of Systems  Constitutional: Positive for malaise/fatigue. Negative for fever.  HENT: Positive for hearing loss, ear pain, congestion and sore throat.   Eyes: Negative for discharge.  Respiratory: Positive for sputum production. Negative for cough and shortness of breath.   Cardiovascular: Negative for chest pain, palpitations and leg swelling.  Gastrointestinal: Positive for nausea. Negative for vomiting, abdominal pain and diarrhea.  Genitourinary: Negative for dysuria.  Musculoskeletal: Negative for falls.  Skin: Negative for rash.  Neurological: Positive for headaches. Negative for loss of consciousness.  Endo/Heme/Allergies: Negative for polydipsia.  Psychiatric/Behavioral: Negative for depression and suicidal ideas. The patient is not nervous/anxious and does not have insomnia.     Objective  BP 127/86  Pulse 59  Temp(Src) 99.6 F (37.6  C) (Temporal)  Ht 5\' 2"  (1.575 m)  Wt 160 lb (72.576 kg)  BMI 29.26 kg/m2  SpO2 100%  LMP 04/24/2011  Physical Exam  Physical Exam  Constitutional: She is oriented to person, place, and time and well-developed, well-nourished, and in no distress. No distress.  HENT:  Head: Normocephalic and atraumatic.       Right TM bulging, dull, with fluid behind, left TM mildly dull  Eyes: Conjunctivae are normal.  Neck: Neck supple. No thyromegaly present.  Cardiovascular: Normal rate, regular rhythm and normal heart sounds.   No murmur heard. Pulmonary/Chest: Effort normal and breath  sounds normal. She has no wheezes.  Abdominal: She exhibits no distension and no mass.  Musculoskeletal: She exhibits no edema.  Lymphadenopathy:    She has no cervical adenopathy.  Neurological: She is alert and oriented to person, place, and time.  Skin: Skin is warm and dry. No rash noted. She is not diaphoretic.  Psychiatric: Memory, affect and judgment normal.    Lab Results  Component Value Date   TSH 1.388 02/11/2007   Lab Results  Component Value Date   WBC 8.3 02/11/2007   HGB 14.0 02/11/2007   HCT 41.7 02/11/2007   MCV 91.9 02/11/2007   PLT 312 02/11/2007      Assessment & Plan  Recurrent acute sinusitis Start new antibiotics today, restart Mucinex, increase rest and increase fluids  OTITIS MEDIA, SEROUS, BILATERAL Presistent, recommended gentle valsava maneuver  RHINITIS, ALLERGIC Fexofenadine and nasal saline

## 2011-05-17 NOTE — Assessment & Plan Note (Signed)
Start new antibiotics today, restart Mucinex, increase rest and increase fluids

## 2011-05-17 NOTE — Assessment & Plan Note (Signed)
Fexofenadine and nasal saline

## 2011-10-21 ENCOUNTER — Ambulatory Visit (INDEPENDENT_AMBULATORY_CARE_PROVIDER_SITE_OTHER): Payer: BC Managed Care – PPO | Admitting: Family Medicine

## 2011-10-21 ENCOUNTER — Encounter: Payer: Self-pay | Admitting: Family Medicine

## 2011-10-21 VITALS — BP 121/85 | HR 95 | Temp 97.9°F | Ht 62.0 in | Wt 157.8 lb

## 2011-10-21 DIAGNOSIS — N200 Calculus of kidney: Secondary | ICD-10-CM | POA: Insufficient documentation

## 2011-10-21 DIAGNOSIS — J310 Chronic rhinitis: Secondary | ICD-10-CM

## 2011-10-21 DIAGNOSIS — K5900 Constipation, unspecified: Secondary | ICD-10-CM

## 2011-10-21 DIAGNOSIS — R300 Dysuria: Secondary | ICD-10-CM

## 2011-10-21 DIAGNOSIS — R319 Hematuria, unspecified: Secondary | ICD-10-CM

## 2011-10-21 DIAGNOSIS — N390 Urinary tract infection, site not specified: Secondary | ICD-10-CM

## 2011-10-21 HISTORY — DX: Constipation, unspecified: K59.00

## 2011-10-21 HISTORY — DX: Urinary tract infection, site not specified: N39.0

## 2011-10-21 HISTORY — DX: Calculus of kidney: N20.0

## 2011-10-21 LAB — POCT URINALYSIS DIPSTICK
Glucose, UA: NEGATIVE
Ketones, UA: 40
Nitrite, UA: POSITIVE

## 2011-10-21 MED ORDER — PHENAZOPYRIDINE HCL 200 MG PO TABS: 200.0000 mg | ORAL_TABLET | Freq: Three times a day (TID) | ORAL | Status: DC | PRN

## 2011-10-21 MED ORDER — PSYLLIUM 58.6 % PO POWD: 1.0000 | Freq: Every day | ORAL | Status: DC

## 2011-10-21 MED ORDER — SULFAMETHOXAZOLE-TRIMETHOPRIM 800-160 MG PO TABS: 1.0000 | ORAL_TABLET | Freq: Two times a day (BID) | ORAL | Status: DC

## 2011-10-21 MED ORDER — ALIGN PO CAPS: 1.0000 | ORAL_CAPSULE | Freq: Every day | ORAL | Status: DC

## 2011-10-21 NOTE — Patient Instructions (Addendum)

## 2011-10-21 NOTE — Assessment & Plan Note (Signed)
Push clear fluid, add Metamucil, start a probiotic, try prune juice, consider MOM as needed

## 2011-10-21 NOTE — Progress Notes (Signed)
Patient ID: Roberta Wu, female   DOB: 1980/12/21, 31 y.o.   MRN: [id number] ALDANA HAJJAR [id number] [birth year] 10/21/2011      Progress Note-Follow Up  Subjective  Chief Complaint  Chief Complaint  Patient presents with  . possible uti    urine smells X 1 month, lower back and stomach hurts X 3 days, chills the last 2 nights  . Nasal Congestion    HPI  Patient is a 31 year old Caucasian female who is in today for evaluation of worsening urinary symptoms. She reports she first noted her urine being somewhat malodorous about a month ago. He did relatively well still couple of days ago when she began to notice some low back pain as well some suprapubic pressure. She denies dysuria or urinary frequency but does note some mild urgency. Has had some low-grade fevers and chills and last 2 days. Also complains of some mild nasal congestion but no rhinorrhea, headache, sore throat. She does note constipation has been an issue off and on for the last month. She notes) urine is alert she isn't having trouble gowing and she was doing okay until last couple days and has not had a bowel movement in 2 days. No bloody or tarry stool  Past Medical History  Diagnosis Date  . Chicken pox as a child  . Migraines   . Allergy   . Otitis media of right ear   . Recurrent acute sinusitis   . Preventative health care 10/22/2010  . Bronchitis 03/01/2011  . UTI (lower urinary tract infection) 10/21/2011    Past Surgical History  Procedure Date  . Tubes in ears   . Adnoids     Family History  Problem Relation Age of Onset  . Migraines Mother   . Migraines Brother   . Dementia Maternal Grandmother   . Hypertension Maternal Grandmother   . Hyperlipidemia Maternal Grandmother   . Cancer Paternal Grandfather     lymphoma/smoker  . Stroke Maternal Grandfather   . Depression Paternal Grandmother     History   Social History  . Marital Status: Married    Spouse Name: N/A    Number of Children: N/A    . Years of Education: N/A   Occupational History  . Not on file.   Social History Main Topics  . Smoking status: Never Smoker   . Smokeless tobacco: Never Used  . Alcohol Use: No  . Drug Use: No  . Sexually Active: Yes   Other Topics Concern  . Not on file   Social History Narrative  . No narrative on file    Current Outpatient Prescriptions on File Prior to Visit  Medication Sig Dispense Refill  . flunisolide (NASALIDE) 0.025 % SOLN 2 sprays in each nostril twice daily  1 Bottle  11  . Multiple Vitamin (MULTIVITAMIN) tablet Take 1 tablet by mouth daily.        Allergies  Allergen Reactions  . Cefdinir     REACTION: GI SEs  . Latex   . Penicillins     REACTION: hives    Review of Systems  Review of Systems  Constitutional: Negative for fever and malaise/fatigue.  HENT: Positive for congestion.   Eyes: Negative for discharge.  Respiratory: Negative for shortness of breath.   Cardiovascular: Negative for chest pain, palpitations and leg swelling.  Gastrointestinal: Positive for abdominal pain. Negative for nausea and diarrhea.  Genitourinary: Positive for urgency. Negative for dysuria, frequency and hematuria.  Musculoskeletal: Positive for back  pain. Negative for falls.  Skin: Negative for rash.  Neurological: Negative for loss of consciousness and headaches.  Endo/Heme/Allergies: Negative for polydipsia.  Psychiatric/Behavioral: Negative for depression and suicidal ideas. The patient is not nervous/anxious and does not have insomnia.     Objective  BP 121/85  Pulse 95  Temp 97.9 F (36.6 C) (Temporal)  Ht 5\' 2"  (1.575 m)  Wt 157 lb 12.8 oz (71.578 kg)  BMI 28.86 kg/m2  SpO2 99%  LMP 10/14/2011  Physical Exam  Physical Exam  Constitutional: She is oriented to person, place, and time and well-developed, well-nourished, and in no distress. No distress.  HENT:  Head: Normocephalic and atraumatic.  Eyes: Conjunctivae normal are normal.  Neck: Neck  supple. No thyromegaly present.  Cardiovascular: Normal rate, regular rhythm and normal heart sounds.   No murmur heard. Pulmonary/Chest: Effort normal and breath sounds normal. She has no wheezes.  Abdominal: She exhibits no distension and no mass.  Musculoskeletal: She exhibits no edema.  Lymphadenopathy:    She has no cervical adenopathy.  Neurological: She is alert and oriented to person, place, and time.  Skin: Skin is warm and dry. No rash noted. She is not diaphoretic.  Psychiatric: Memory, affect and judgment normal.    Lab Results  Component Value Date   TSH 1.388 02/11/2007   Lab Results  Component Value Date   WBC 8.3 02/11/2007   HGB 14.0 02/11/2007   HCT 41.7 02/11/2007   MCV 91.9 02/11/2007   PLT 312 02/11/2007     Assessment & Plan  UTI (lower urinary tract infection) Encouraged increased hydration, cranberry, probiotics, cotton undergarments, etc. Urine sent for culture. Patient started on Bactrim DS and Pyrdium prn call if no improvement  Chronic rhinitis Flared a bit, encouraged Fexofenadine and nasal sterorid, report if  No improvement  Constipation Push clear fluid, add Metamucil, start a probiotic, try prune juice, consider MOM as needed

## 2011-10-21 NOTE — Assessment & Plan Note (Signed)
Encouraged increased hydration, cranberry, probiotics, cotton undergarments, etc. Urine sent for culture. Patient started on Bactrim DS and Pyrdium prn call if no improvement

## 2011-10-21 NOTE — Assessment & Plan Note (Signed)
Flared a bit, encouraged Fexofenadine and nasal sterorid, report if  No improvement

## 2011-10-24 LAB — URINE CULTURE: Colony Count: >=100000

## 2011-11-03 ENCOUNTER — Ambulatory Visit (HOSPITAL_BASED_OUTPATIENT_CLINIC_OR_DEPARTMENT_OTHER)
Admission: RE | Admit: 2011-11-03 | Discharge: 2011-11-03 | Disposition: A | Discharge status: Home / Self Care | Payer: BC Managed Care – PPO | Source: Ambulatory Visit | Attending: Family Medicine | Admitting: Family Medicine

## 2011-11-03 ENCOUNTER — Ambulatory Visit (INDEPENDENT_AMBULATORY_CARE_PROVIDER_SITE_OTHER): Payer: BC Managed Care – PPO | Admitting: Family Medicine

## 2011-11-03 ENCOUNTER — Encounter: Payer: Self-pay | Admitting: Family Medicine

## 2011-11-03 ENCOUNTER — Telehealth: Payer: Self-pay

## 2011-11-03 ENCOUNTER — Other Ambulatory Visit: Payer: Self-pay | Admitting: Family Medicine

## 2011-11-03 VITALS — BP 130/85 | HR 105 | Temp 98.9°F | Ht 62.0 in | Wt 162.4 lb

## 2011-11-03 DIAGNOSIS — R319 Hematuria, unspecified: Secondary | ICD-10-CM | POA: Insufficient documentation

## 2011-11-03 DIAGNOSIS — N200 Calculus of kidney: Secondary | ICD-10-CM

## 2011-11-03 DIAGNOSIS — R110 Nausea: Secondary | ICD-10-CM | POA: Insufficient documentation

## 2011-11-03 DIAGNOSIS — R300 Dysuria: Secondary | ICD-10-CM

## 2011-11-03 DIAGNOSIS — R1031 Right lower quadrant pain: Secondary | ICD-10-CM | POA: Insufficient documentation

## 2011-11-03 DIAGNOSIS — N390 Urinary tract infection, site not specified: Secondary | ICD-10-CM

## 2011-11-03 DIAGNOSIS — R109 Unspecified abdominal pain: Secondary | ICD-10-CM

## 2011-11-03 LAB — RENAL FUNCTION PANEL
BUN: 10 mg/dL (ref 6–23)
Calcium: 9 mg/dL (ref 8.4–10.5)
Creatinine, Ser: 0.6 mg/dL (ref 0.4–1.2)
Glucose, Bld: 100 mg/dL — ABNORMAL HIGH (ref 70–99)
Sodium: 138 mEq/L (ref 135–145)

## 2011-11-03 LAB — CBC
HCT: 40.6 % (ref 36.0–46.0)
MCHC: 33 g/dL (ref 30.0–36.0)
MCV: 95.1 fl (ref 78.0–100.0)
RDW: 13.4 % (ref 11.5–14.6)
WBC: 22.8 10*3/uL (ref 4.5–10.5)

## 2011-11-03 LAB — POCT URINALYSIS DIPSTICK
Nitrite, UA: NEGATIVE
Urobilinogen, UA: 0.2
pH, UA: 8.5

## 2011-11-03 LAB — POCT URINE PREGNANCY: Preg Test, Ur: NEGATIVE

## 2011-11-03 MED ORDER — PHENAZOPYRIDINE HCL 200 MG PO TABS: 200.0000 mg | ORAL_TABLET | Freq: Three times a day (TID) | ORAL | Status: DC | PRN

## 2011-11-03 MED ORDER — CIPROFLOXACIN HCL 500 MG PO TABS: 500.0000 mg | ORAL_TABLET | Freq: Two times a day (BID) | ORAL | Status: DC

## 2011-11-03 MED ORDER — HYDROCODONE-ACETAMINOPHEN 7.5-325 MG PO TABS: 1.0000 | ORAL_TABLET | ORAL | Status: DC | PRN

## 2011-11-03 NOTE — Assessment & Plan Note (Signed)
CT scan today 3 mm distal left ureteral calculus causing mild left hydronephrosis. Associated left renal swelling and perinephric stranding. Urine has been sent for culture, patient is aware and reports some pain relief with Hydrocodone 7.5/325. She will continue Ciprofloxacin and may use the pain meds every 4 hours as needed and hydrate well. We will attempt to get her an urgent Urology appt today or tomorrow to have them evaluate for possible removal

## 2011-11-03 NOTE — Patient Instructions (Addendum)

## 2011-11-03 NOTE — Telephone Encounter (Signed)
I informed pt per MD that her WBC was 22.8 and that we just wanted to make sure she gets the antibiotic started. Pt voiced understanding

## 2011-11-03 NOTE — Progress Notes (Signed)
Patient ID: Roberta Wu, female   DOB: 28-Feb-1980, 31 y.o.   MRN: [id number] FRED MCCANLESS [id number] [birth year] 11/03/2011      Progress Note-Follow Up  Subjective  Chief Complaint  Chief Complaint  Patient presents with  . Urinary Tract Infection    pain in L side and while urinating- was here for UTI on 10-21-11- pt states she felt like she was getting better but pain started back yesterdya    HPI  Patient is a 31 year old Caucasian female who is in tonight with abrupt onset of symptoms last night. Back in mid September she had a classic UTI was well treated with Bactrim and was feeling great until yesterday. Very suddenly she had left flank pain urinary frequency, urgency, dysuria. She did not sleep at all due to the persistent nature of her pain. She had colicky episodes of the pain would worsen she will feel nauseous diaphoretic and chilled. The pain would then subside somewhat only to recur again. This morning she is uncomfortable in the exam room but denies fevers, for vomiting, diarrhea, constipation or hematuria. She's a very strong family history of kidney stones. Her dad Morrie Sheldon is being treated for one presently and her brother has been variable currently.  Past Medical History  Diagnosis Date  . Chicken pox as a child  . Migraines   . Allergy   . Otitis media of right ear   . Recurrent acute sinusitis   . Preventative health care 10/22/2010  . Bronchitis 03/01/2011  . UTI (lower urinary tract infection) 10/21/2011  . Constipation 10/21/2011  . Kidney stone on left side 10/21/2011    Past Surgical History  Procedure Date  . Tubes in ears   . Adnoids     Family History  Problem Relation Age of Onset  . Migraines Mother   . Migraines Brother   . Dementia Maternal Grandmother   . Hypertension Maternal Grandmother   . Hyperlipidemia Maternal Grandmother   . Cancer Paternal Grandfather     lymphoma/smoker  . Stroke Maternal Grandfather   . Depression Paternal  Grandmother     History   Social History  . Marital Status: Married    Spouse Name: N/A    Number of Children: N/A  . Years of Education: N/A   Occupational History  . Not on file.   Social History Main Topics  . Smoking status: Never Smoker   . Smokeless tobacco: Never Used  . Alcohol Use: No  . Drug Use: No  . Sexually Active: Yes   Other Topics Concern  . Not on file   Social History Narrative  . No narrative on file    Current Outpatient Prescriptions on File Prior to Visit  Medication Sig Dispense Refill  . cetirizine (ZYRTEC) 10 MG tablet Take 10 mg by mouth daily.      . Multiple Vitamin (MULTIVITAMIN) tablet Take 1 tablet by mouth daily.      . psyllium (METAMUCIL SMOOTH TEXTURE) 58.6 % powder Take 1 packet by mouth daily.  283 g  12  . flunisolide (NASALIDE) 0.025 % SOLN 2 sprays in each nostril twice daily  1 Bottle  11    Allergies  Allergen Reactions  . Cefdinir     REACTION: GI SEs  . Latex   . Penicillins     REACTION: hives    Review of Systems  Review of Systems  Constitutional: Positive for chills and malaise/fatigue. Negative for fever.  HENT: Negative for congestion.  Eyes: Negative for discharge.  Respiratory: Negative for shortness of breath.   Cardiovascular: Negative for chest pain, palpitations and leg swelling.  Gastrointestinal: Positive for abdominal pain. Negative for nausea and diarrhea.  Genitourinary: Positive for dysuria, urgency, frequency and flank pain.  Musculoskeletal: Negative for falls.  Skin: Negative for rash.  Neurological: Negative for loss of consciousness and headaches.  Endo/Heme/Allergies: Negative for polydipsia.  Psychiatric/Behavioral: Negative for depression and suicidal ideas. The patient has insomnia. The patient is not nervous/anxious.     Objective  BP 130/85  Pulse 105  Temp 98.9 F (37.2 C) (Temporal)  Ht 5\' 2"  (1.575 m)  Wt 162 lb 6.4 oz (73.664 kg)  BMI 29.70 kg/m2  SpO2 99%  LMP  10/14/2011  Physical Exam  Physical Exam  Constitutional: She is oriented to person, place, and time and well-developed, well-nourished, and in no distress. No distress.  HENT:  Head: Normocephalic and atraumatic.  Eyes: Conjunctivae normal are normal.  Neck: Neck supple. No thyromegaly present.  Cardiovascular: Normal rate, regular rhythm and normal heart sounds.   No murmur heard. Pulmonary/Chest: Effort normal and breath sounds normal. She has no wheezes.  Abdominal: Soft. Bowel sounds are normal. She exhibits no distension and no mass. There is tenderness. There is no rebound and no guarding.       Tender with palp on RLQ, tender with palpation on left flank  Musculoskeletal: She exhibits no edema and no tenderness.  Lymphadenopathy:    She has no cervical adenopathy.  Neurological: She is alert and oriented to person, place, and time.  Skin: Skin is warm and dry. No rash noted. She is not diaphoretic.  Psychiatric: Memory, affect and judgment normal.    Lab Results  Component Value Date   TSH 1.388 02/11/2007   Lab Results  Component Value Date   WBC 8.3 02/11/2007   HGB 14.0 02/11/2007   HCT 41.7 02/11/2007   MCV 91.9 02/11/2007   PLT 312 02/11/2007     Assessment & Plan  Kidney stone on left side CT scan today 3 mm distal left ureteral calculus causing mild left hydronephrosis. Associated left renal swelling and perinephric stranding. Urine has been sent for culture, patient is aware and reports some pain relief with Hydrocodone 7.5/325. She will continue Ciprofloxacin and may use the pain meds every 4 hours as needed and hydrate well. We will attempt to get her an urgent Urology appt today or tomorrow to have them evaluate for possible removal

## 2012-03-19 ENCOUNTER — Other Ambulatory Visit: Payer: Self-pay

## 2012-06-03 ENCOUNTER — Other Ambulatory Visit: Payer: Self-pay | Admitting: Obstetrics and Gynecology

## 2012-12-08 ENCOUNTER — Other Ambulatory Visit: Payer: Self-pay

## 2013-06-12 ENCOUNTER — Other Ambulatory Visit: Payer: Self-pay | Admitting: Obstetrics and Gynecology

## 2014-08-24 ENCOUNTER — Other Ambulatory Visit: Payer: Self-pay | Admitting: Obstetrics and Gynecology

## 2014-08-27 LAB — CYTOLOGY - PAP

## 2016-02-05 ENCOUNTER — Ambulatory Visit (INDEPENDENT_AMBULATORY_CARE_PROVIDER_SITE_OTHER): Payer: Self-pay | Admitting: Orthopedic Surgery

## 2016-02-05 ENCOUNTER — Ambulatory Visit (INDEPENDENT_AMBULATORY_CARE_PROVIDER_SITE_OTHER): Payer: Self-pay

## 2016-02-05 DIAGNOSIS — M7541 Impingement syndrome of right shoulder: Secondary | ICD-10-CM | POA: Insufficient documentation

## 2016-02-05 DIAGNOSIS — M542 Cervicalgia: Secondary | ICD-10-CM

## 2016-02-05 DIAGNOSIS — M25511 Pain in right shoulder: Secondary | ICD-10-CM

## 2016-02-05 DIAGNOSIS — G8929 Other chronic pain: Secondary | ICD-10-CM

## 2016-02-05 MED ORDER — METHYLPREDNISOLONE ACETATE 40 MG/ML IJ SUSP
40.0000 mg | INTRAMUSCULAR | Status: AC | PRN
  Administered 2016-02-05: 40 mg via INTRA_ARTICULAR

## 2016-02-05 MED ORDER — LIDOCAINE HCL 1 % IJ SOLN
5.0000 mL | INTRAMUSCULAR | Status: AC | PRN
  Administered 2016-02-05: 5 mL

## 2016-02-05 NOTE — Progress Notes (Signed)
   Procedure Note  Patient: Roberta Wu             Date of Birth: 1980/02/16           MRN: [id number]             Visit Date: 02/05/2016  Procedures: Visit Diagnoses: Impingement syndrome of right shoulder  Chronic right shoulder pain - Plan: XR Shoulder Right  Neck pain - Plan: XR Cervical Spine 2 or 3 views  Large Joint Inj Date/Time: 02/05/2016 2:02 PM Performed by: Nadara MustardUDA, Windell Musson V Authorized by: Nadara MustardUDA, Jerauld Bostwick V   Consent Given by:  Patient Site marked: the procedure site was marked   Timeout: prior to procedure the correct patient, procedure, and site was verified   Indications:  Pain and diagnostic evaluation Location:  Shoulder Site:  R glenohumeral Prep: patient was prepped and draped in usual sterile fashion   Needle Size:  22 G Needle Length:  1.5 inches Ultrasound Guidance: No   Fluoroscopic Guidance: No   Arthrogram: No   Medications:  5 mL lidocaine 1 %; 40 mg methylPREDNISolone acetate 40 MG/ML Aspiration Attempted: No   Patient tolerance:  Patient tolerated the procedure well with no immediate complications

## 2016-02-05 NOTE — Progress Notes (Addendum)
   Office Visit Note   Patient: Roberta Wu           Date of Birth: April 13, 1980           MRN: [id number] Visit Date: 02/05/2016              Requested by: No referring provider defined for this encounter. PCP: No primary care provider on file.   Assessment & Plan: Visit Diagnoses:  1. Impingement syndrome of right shoulder   2. Chronic right shoulder pain   3. Neck pain     Plan: Right shoulder was injected in the subacromial space from the posterior portal. She tolerated this well plan follow-up in 4 weeks. Discussed that if she is still symptomatic there may be more intra-articular pathology.  Follow-Up Instructions: Return in about 4 weeks (around 03/04/2016).   Orders:  Orders Placed This Encounter  Procedures  . Large Joint Injection/Arthrocentesis  . XR Shoulder Right  . XR Cervical Spine 2 or 3 views   No orders of the defined types were placed in this encounter.     Procedures: No procedures performed   Clinical Data: No additional findings.   Subjective: Chief Complaint  Patient presents with  . Spine - Pain    c spine and right shoulder pain without any defining injury date.  . Right Shoulder - Pain    HPI patient presents complaining of anterior biceps pain on the right. She states that she was pitching for softball and began burning and painful down into her biceps. Also complains of some trapezius pain in her neck at this time. She is difficulty reaching behind or self difficulty opening up jars.  Review of Systems   Objective: Vital Signs: There were no vitals taken for this visit.  Physical Exam patient is alert oriented no adenopathy well-dressed normal affect normal respiratory effort she has a normal gait. Examination she has decreased range of motion the right shoulder compared to the left. She has pain with Neer and Hawkins impingement test she has pain to palpation of the biceps tendon. The thoracic outlet is nontender to palpation. She  has good range of motion the cervical spine.  Ortho Exam  Specialty Comments:  No specialty comments available.  Imaging: Xr Cervical Spine 2 Or 3 Views  Result Date: 02/05/2016 2 view radiographs of the cervical spine shows no bony abnormalities noted joint space narrowing.  Xr Shoulder Right  Result Date: 02/05/2016 Two-view radiographs of the right shoulder shows a congruent glenohumeral joint she does have a type III acromion with decreased subacromial joint space.    PMFS History: Patient Active Problem List   Diagnosis Date Noted  . Impingement syndrome of right shoulder 02/05/2016   No past medical history on file.  No family history on file.  No past surgical history on file. Social History   Occupational History  . Not on file.   Social History Main Topics  . Smoking status: Not on file  . Smokeless tobacco: Not on file  . Alcohol use Not on file  . Drug use: Unknown  . Sexual activity: Not on file

## 2016-03-04 ENCOUNTER — Ambulatory Visit (INDEPENDENT_AMBULATORY_CARE_PROVIDER_SITE_OTHER): Payer: Self-pay | Admitting: Family

## 2016-03-04 DIAGNOSIS — M5412 Radiculopathy, cervical region: Secondary | ICD-10-CM

## 2016-03-04 DIAGNOSIS — M7541 Impingement syndrome of right shoulder: Secondary | ICD-10-CM

## 2016-03-04 MED ORDER — PREDNISONE 10 MG PO TABS: ORAL_TABLET | ORAL | 0 refills | Status: AC

## 2016-03-04 NOTE — Progress Notes (Signed)
Office Visit Note   Patient: Roberta Wu           Date of Birth: 02-11-1980           MRN: [id number] Visit Date: 03/04/2016              Requested by: Bradd Canary, MD [address] [place], [place] [address] PCP: Danise Edge, MD   Assessment & Plan: Visit Diagnoses:  1. Right cervical radiculopathy   2. Impingement syndrome of right shoulder     Plan:  discussed possible pathology with her. really seems like she has some radicular symptoms coming from the neck in combination with impingement of the right shoulder. We'll order MRIs of both the shoulder and the neck. She'll follow up in the's with Dr. due to to discuss results.  Follow-Up Instructions: No Follow-up on file.   Orders:  Orders Placed This Encounter  Procedures  . MR Cervical Spine w/o contrast  . MR SHOULDER RIGHT WO CONTRAST   Meds ordered this encounter  Medications  . predniSONE (DELTASONE) 10 MG tablet    Sig: 6 tablets x 2 days, 3 x 2days, then 4 x 2 days, then 3 x 2 days, then 2 x 2 days, then 1 x 2 days    Dispense:  42 tablet    Refill:  0      Procedures: No procedures performed   Clinical Data: No additional findings.   Subjective: Chief Complaint  Patient presents with  . Right Shoulder - Pain, Follow-up  . Neck - Pain, Follow-up    Patient follows up for right shoulder pain and neck pain. Injection given in right shoulder 02/05/16. Injection helped some. She got some relief but not enough. She states she gets some tingling and numbness. Pain comes and goes. As far as her neck pain, she still has constant pain. Not any better. She feels like its getting worse.  The patient is a 36 year old woman who presents today in follow-up for right shoulder pain right sided neck pain. States she is about 4 weeks out from a steroid injection in the right shoulder. States this did help some. But has not completely relieved her symptoms. She continues to have tingling and numbness  down the right arm into the fingers and hand. Dull aching pain in the biceps.   The neck pain has been ongoing. The shoulder pain is acute.  Notes that when she tilts her back this reproduces the symptoms of neck pain as well as shooting pain tingling and numbness down the right arm.  Points to the right trapezius states this is tender as well.    Review of Systems  Constitutional: Negative for chills and fever.     Objective: Vital Signs: There were no vitals taken for this visit.  Physical Exam  Constitutional: She is oriented to person, place, and time. She appears well-developed and well-nourished.  Neck: Normal range of motion.  Positive Spurling's. No spinous process tenderness.  Pulmonary/Chest: Effort normal.  Neurological: She is alert and oriented to person, place, and time.  Psychiatric: She has a normal mood and affect.  Nursing note reviewed.   Back Exam   Tenderness  The patient is experiencing tenderness in the cervical.   Right Shoulder Exam   Tenderness  The patient is experiencing tenderness in the biceps tendon.  Range of Motion  The patient has normal right shoulder ROM.  Muscle Strength  The patient has normal right  shoulder strength.  Tests  Impingement: positive  Other  Sensation: normal Pulse: present      Specialty Comments:  No specialty comments available.  Imaging: No results found.   PMFS History: Patient Active Problem List   Diagnosis Date Noted  . Impingement syndrome of right shoulder 02/05/2016  . Kidney stone on left side 10/21/2011  . Constipation 10/21/2011  . Sinusitis acute 05/07/2011  . Chronic rhinitis 05/07/2011  . Preventative health care 10/22/2010  . Recurrent acute sinusitis   . OTITIS MEDIA, SEROUS, BILATERAL 07/18/2007  . DERMATITIS, ATOPIC 02/10/2007  . PALPITATIONS 02/10/2007  . MIGRAINE, UNSPEC., W/O INTRACTABLE MIGRAINE 11/10/2005  . RHINITIS, ALLERGIC 11/10/2005   Past Medical History:    Diagnosis Date  . Allergy   . Bronchitis 03/01/2011  . Chicken pox as a child  . Constipation 10/21/2011  . Kidney stone on left side 10/21/2011  . Migraines   . Otitis media of right ear   . Preventative health care 10/22/2010  . Recurrent acute sinusitis   . UTI (lower urinary tract infection) 10/21/2011    Family History  Problem Relation Age of Onset  . Migraines Mother   . Migraines Brother   . Dementia Maternal Grandmother   . Hypertension Maternal Grandmother   . Hyperlipidemia Maternal Grandmother   . Cancer Paternal Grandfather     lymphoma/smoker  . Stroke Maternal Grandfather   . Depression Paternal Grandmother     Past Surgical History:  Procedure Laterality Date  . adnoids    . tubes in ears     Social History   Occupational History  . Not on file.   Social History Main Topics  . Smoking status: Never Smoker  . Smokeless tobacco: Never Used  . Alcohol use No  . Drug use: No  . Sexual activity: Yes

## 2016-03-08 ENCOUNTER — Ambulatory Visit
Admission: RE | Admit: 2016-03-08 | Discharge: 2016-03-08 | Disposition: A | Discharge status: Home / Self Care | Payer: Self-pay | Source: Ambulatory Visit | Attending: Family | Admitting: Family

## 2016-03-08 DIAGNOSIS — M5412 Radiculopathy, cervical region: Secondary | ICD-10-CM

## 2016-03-08 DIAGNOSIS — M7541 Impingement syndrome of right shoulder: Secondary | ICD-10-CM

## 2016-03-10 NOTE — Progress Notes (Signed)
Will you call patient and have her come in for MRI review whenever is convenient

## 2016-03-13 ENCOUNTER — Telehealth (INDEPENDENT_AMBULATORY_CARE_PROVIDER_SITE_OTHER): Payer: Self-pay | Admitting: Orthopedic Surgery

## 2016-03-13 NOTE — Telephone Encounter (Signed)
Pt requested a call back regarding MRI results   [phone]

## 2016-03-13 NOTE — Telephone Encounter (Signed)
Mri results

## 2016-03-16 NOTE — Telephone Encounter (Signed)
Call Patient Roberta Wu MRI scan of the shoulder shows some inflammation of the rotator cuff but no rotator cuff tear. Roberta Wu MRI scan of Roberta Wu neck shows some bulging disks which could be the source of Roberta Wu problems. Recommend an appointment with Dr. Alvester MorinNewton for follow-up for evaluation for epidural steroid injection for Roberta Wu cervical spine.

## 2016-03-17 ENCOUNTER — Encounter (INDEPENDENT_AMBULATORY_CARE_PROVIDER_SITE_OTHER): Payer: Self-pay | Admitting: Orthopedic Surgery

## 2016-03-17 ENCOUNTER — Ambulatory Visit (INDEPENDENT_AMBULATORY_CARE_PROVIDER_SITE_OTHER): Payer: Self-pay | Admitting: Orthopedic Surgery

## 2016-03-17 VITALS — Ht 62.0 in | Wt 162.0 lb

## 2016-03-17 DIAGNOSIS — M4802 Spinal stenosis, cervical region: Secondary | ICD-10-CM | POA: Insufficient documentation

## 2016-03-17 NOTE — Progress Notes (Signed)
Office Visit Note   Patient: Roberta Wu           Date of Birth: 25-Aug-1980           MRN: [id number] Visit Date: 03/17/2016              Requested by: Bradd CanaryStacey A Blyth, MD [address] [place], [place] [id number] PCP: Danise EdgeStacey Blyth, MD  Chief Complaint  Patient presents with  . Right Shoulder - Follow-up    HPI: Review MRI right shoulder  MRI of cervical spine  Assessment & Plan: Visit Diagnoses:  1. Spinal stenosis of cervical region     Plan: We'll set up for evaluation for epidural steroid injection with Dr. Alvester MorinNewton. She does have a right-sided disc at C5-6 causing her symptoms on the right shoulder.  Follow-Up Instructions: Return if symptoms worsen or fail to improve.   Ortho Exam On examination patient is alert oriented no adenopathy well-dressed normal affect normal respiratory effort she has a normal gait. Symptoms are reproduced with extension of her cervical spine thoracic outlet is nontender. She has no focal motor weakness in either upper extremity. View of the MRI scan of the right shoulder shows no specific abnormalities she has some mild rotator cuff tendinopathy. Examination of the MRI scan of her cervical spine shows a C5-6 disc to the right with stenosis and mild mass effect. There is also a left disc at C6-7 however she is asymptomatic on the left side. Right-sided symptoms are primarily over the lateral deltoid as well as over the medial scapular border.  Imaging: No results found.  Orders:  Orders Placed This Encounter  Procedures  . Ambulatory referral to Physical Medicine Rehab   No orders of the defined types were placed in this encounter.    Procedures: No procedures performed  Clinical Data: No additional findings.  Subjective: Review of Systems  Objective: Vital Signs: Ht 5\' 2"  (1.575 m)   Wt 162 lb (73.5 kg)   BMI 29.63 kg/m   Specialty Comments:  No specialty comments available.  PMFS History: Patient Active  Problem List   Diagnosis Date Noted  . Spinal stenosis of cervical region 03/17/2016  . Impingement syndrome of right shoulder 02/05/2016  . Kidney stone on left side 10/21/2011  . Constipation 10/21/2011  . Sinusitis acute 05/07/2011  . Chronic rhinitis 05/07/2011  . Preventative health care 10/22/2010  . Recurrent acute sinusitis   . OTITIS MEDIA, SEROUS, BILATERAL 07/18/2007  . DERMATITIS, ATOPIC 02/10/2007  . PALPITATIONS 02/10/2007  . MIGRAINE, UNSPEC., W/O INTRACTABLE MIGRAINE 11/10/2005  . RHINITIS, ALLERGIC 11/10/2005   Past Medical History:  Diagnosis Date  . Allergy   . Bronchitis 03/01/2011  . Chicken pox as a child  . Constipation 10/21/2011  . Kidney stone on left side 10/21/2011  . Migraines   . Otitis media of right ear   . Preventative health care 10/22/2010  . Recurrent acute sinusitis   . UTI (lower urinary tract infection) 10/21/2011    Family History  Problem Relation Age of Onset  . Migraines Mother   . Migraines Brother   . Dementia Maternal Grandmother   . Hypertension Maternal Grandmother   . Hyperlipidemia Maternal Grandmother   . Cancer Paternal Grandfather     lymphoma/smoker  . Stroke Maternal Grandfather   . Depression Paternal Grandmother     Past Surgical History:  Procedure Laterality Date  . adnoids    . tubes in ears  Social History   Occupational History  . Not on file.   Social History Main Topics  . Smoking status: Never Smoker  . Smokeless tobacco: Never Used  . Alcohol use No  . Drug use: No  . Sexual activity: Yes

## 2016-03-19 NOTE — Progress Notes (Signed)
Called patient advised she  came in Tuesday 02/15/16 for MRI review

## 2016-03-25 ENCOUNTER — Encounter (INDEPENDENT_AMBULATORY_CARE_PROVIDER_SITE_OTHER): Payer: Self-pay | Admitting: Physical Medicine and Rehabilitation

## 2016-03-25 ENCOUNTER — Other Ambulatory Visit (INDEPENDENT_AMBULATORY_CARE_PROVIDER_SITE_OTHER): Payer: Self-pay | Admitting: Orthopedic Surgery

## 2016-03-25 DIAGNOSIS — M4802 Spinal stenosis, cervical region: Secondary | ICD-10-CM

## 2016-03-31 NOTE — Discharge Instructions (Signed)
Post Procedure Spinal Discharge Instruction Sheet  1. You may resume a regular diet and any medications that you routinely take (including pain medications).  2. No driving day of procedure.  3. Light activity throughout the rest of the day.  Do not do any strenuous work, exercise, bending or lifting.  The day following the procedure, you can resume normal physical activity but you should refrain from exercising or physical therapy for at least three days thereafter.   Common Side Effects:   Headaches- take your usual medications as directed by your physician.  Increase your fluid intake.  Caffeinated beverages may be helpful.  Lie flat in bed until your headache resolves.   Restlessness or inability to sleep- you may have trouble sleeping for the next few days.  Ask your referring physician if you need any medication for sleep.   Facial flushing or redness- should subside within a few days.   Increased pain- a temporary increase in pain a day or two following your procedure is not unusual.  Take your pain medication as prescribed by your referring physician.   Leg cramps  Please contact our office at [phone] for the following symptoms:  Fever greater than 100 degrees.  Headaches unresolved with medication after 2-3 days.  Increased swelling, pain, or redness at injection site.  Thank you for visiting our office.  If you need to schedule or reschedule please call [phone].

## 2016-04-01 ENCOUNTER — Encounter: Payer: Self-pay | Admitting: Radiology

## 2016-04-01 ENCOUNTER — Ambulatory Visit
Admission: RE | Admit: 2016-04-01 | Discharge: 2016-04-01 | Disposition: A | Discharge status: Home / Self Care | Payer: No Typology Code available for payment source | Source: Ambulatory Visit | Attending: Orthopedic Surgery | Admitting: Orthopedic Surgery

## 2016-04-01 DIAGNOSIS — M4802 Spinal stenosis, cervical region: Secondary | ICD-10-CM

## 2016-04-01 MED ORDER — TRIAMCINOLONE ACETONIDE 40 MG/ML IJ SUSP (RADIOLOGY)
60.0000 mg | Freq: Once | INTRAMUSCULAR | Status: AC
  Administered 2016-04-01: 60 mg via EPIDURAL

## 2016-04-01 MED ORDER — IOPAMIDOL (ISOVUE-M 300) INJECTION 61%
1.0000 mL | Freq: Once | INTRAMUSCULAR | Status: AC | PRN
  Administered 2016-04-01: 1 mL via EPIDURAL

## 2017-06-02 IMAGING — MR MR SHOULDER*R* W/O CM
4 of 5 series · 15 of 40 positions shown · non-contrast
Comparison: None.

CLINICAL DATA: Right shoulder pain for the past several months.

EXAM:
MRI OF THE RIGHT SHOULDER WITHOUT CONTRAST
TECHNIQUE: Multiplanar, multisequence MR imaging of the shoulder was performed.
No intravenous contrast was administered.

[Series 9: T2 fat-sat · axial · right · 3.0mm · 0.44mm/px · z∈[-1,+50]mm · 3 of 22 slices shown (1 of 3)]
[im 3/22]
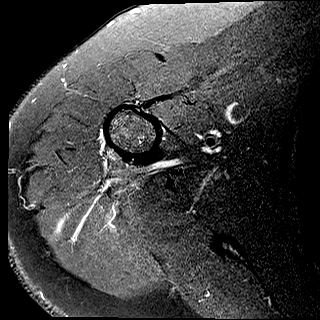
[im 12/22]
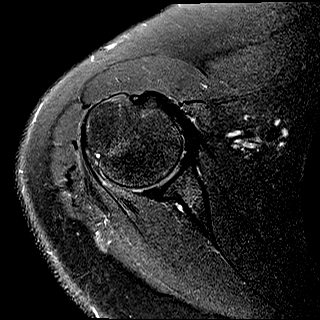
[im 19/22]
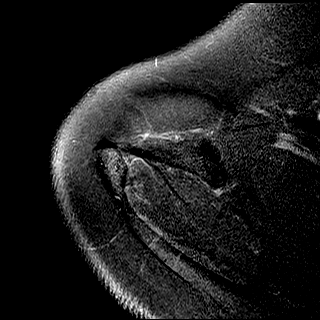

[Series 10: T2 fat-sat · oblique · right · 3.0mm · 0.44mm/px · 3 of 21 slices shown (2 of 3)]
[im 3/21]
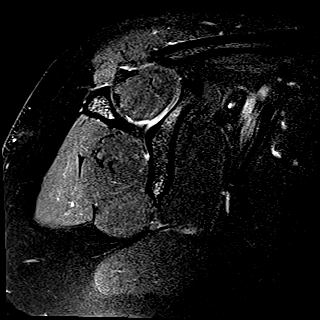
[im 12/21]
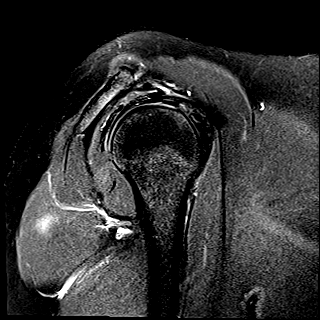
[im 18/21]
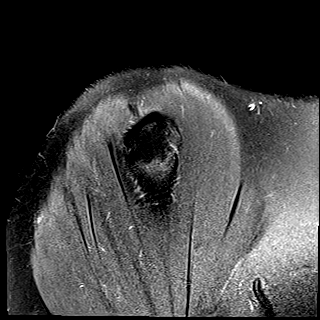

[Series 12: PD · oblique · right · 3.0mm · 0.18mm/px · 6 of 18 slices shown]
[im 1/18]
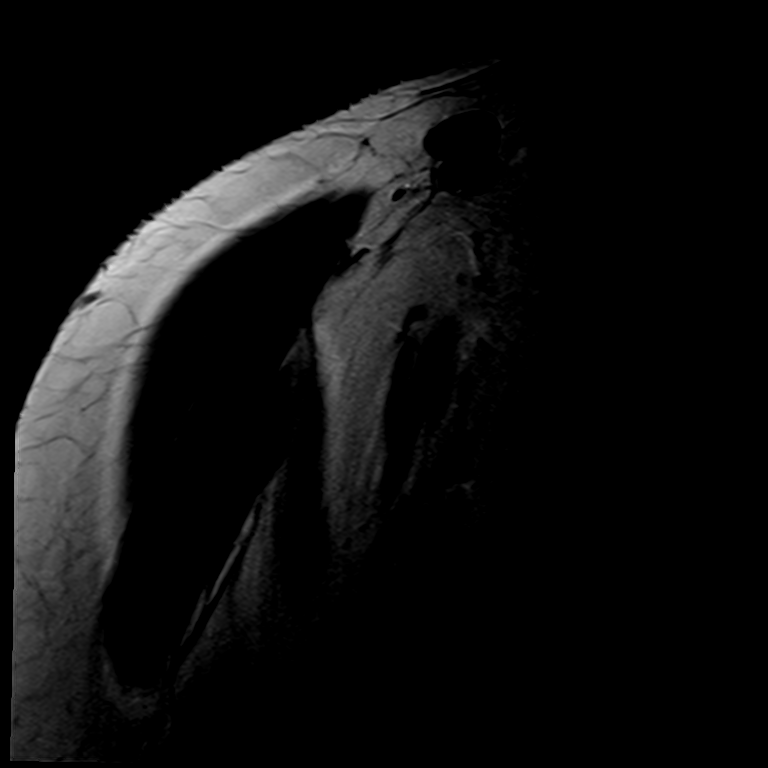
[im 3/18]
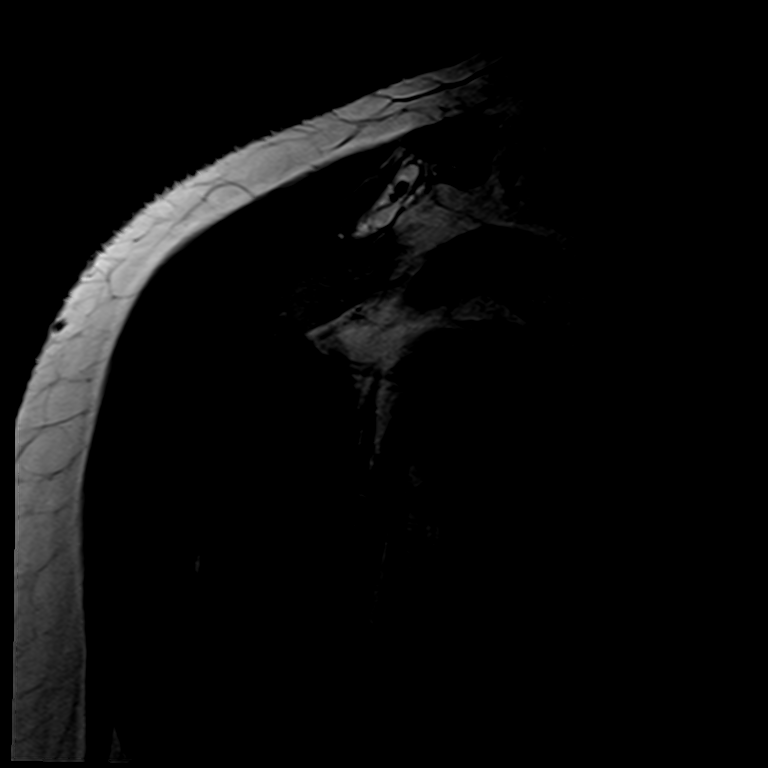
[im 6/18]
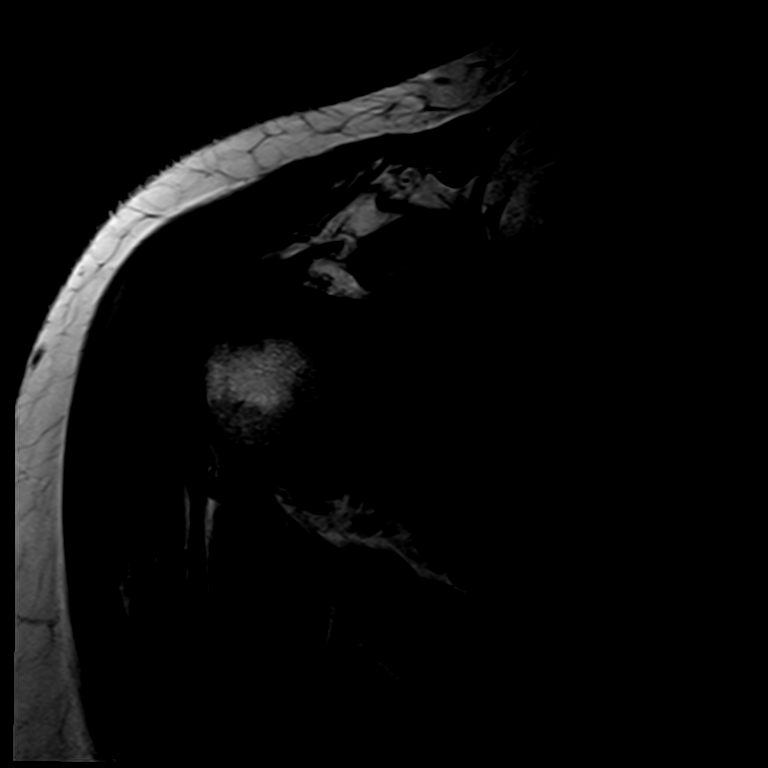
[im 9/18]
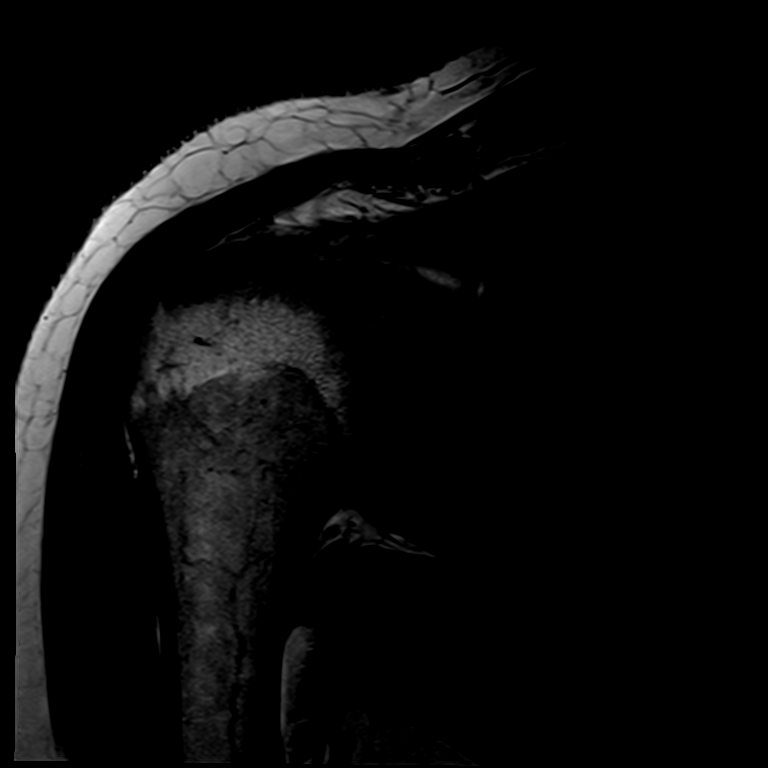
[im 12/18]
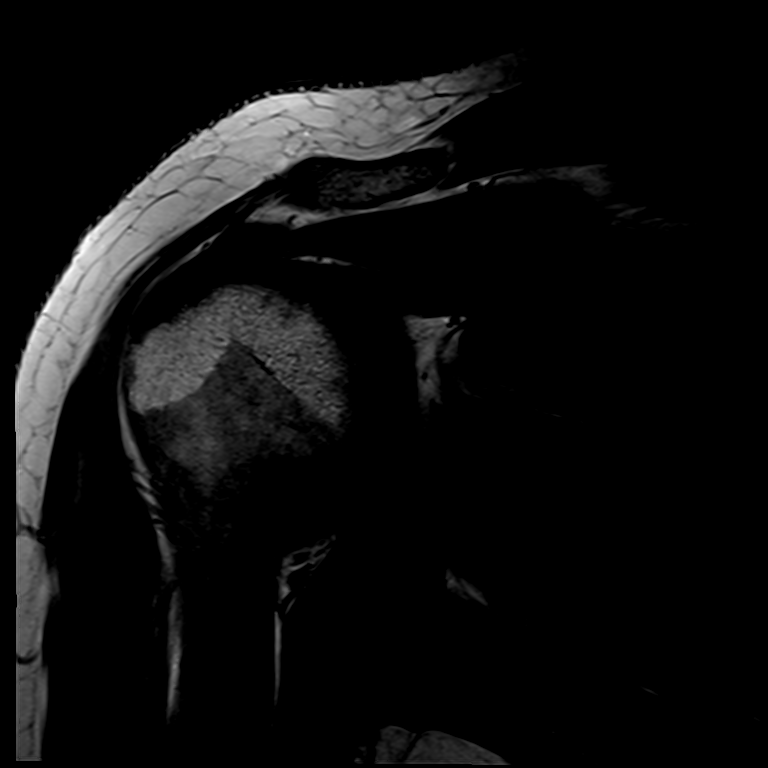
[im 15/18]
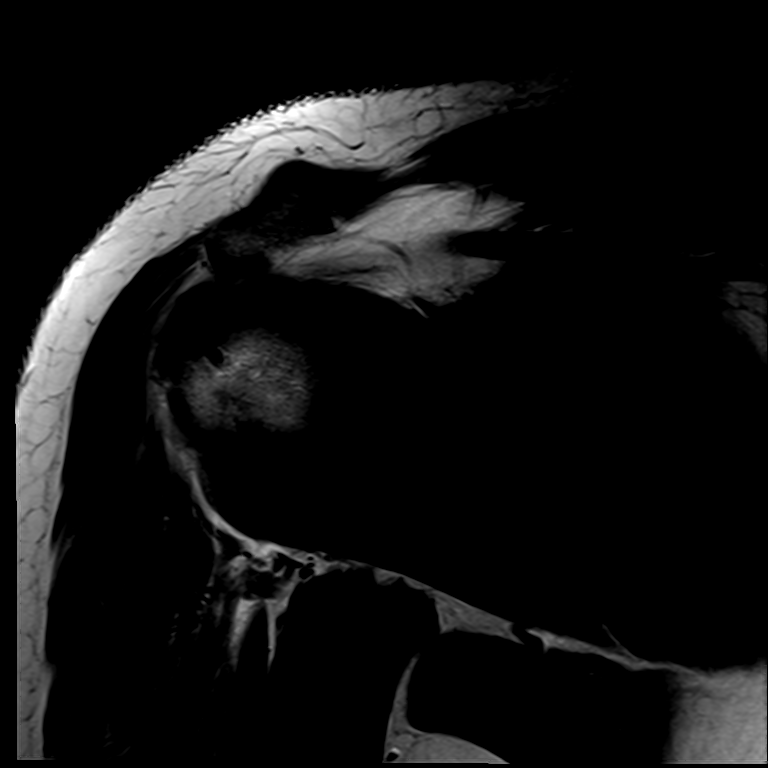

[Series 13: T2 fat-sat · oblique · right · 3.0mm · 0.22mm/px · 3 of 18 slices shown (3 of 3)]
[im 3/18]
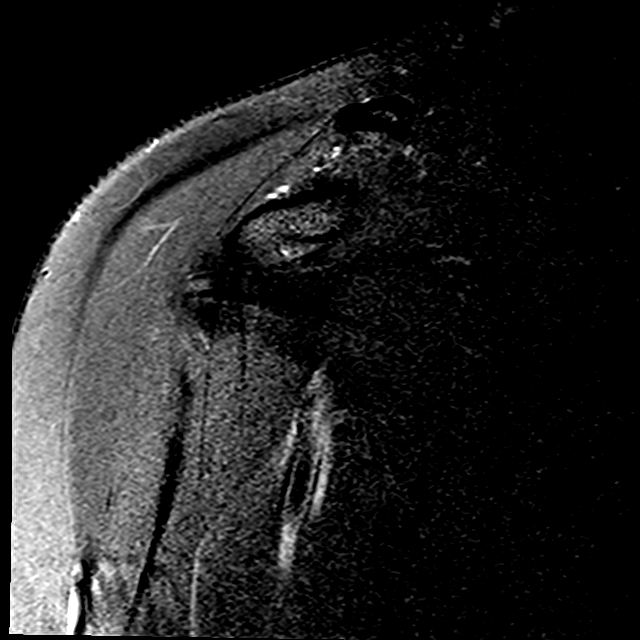
[im 9/18]
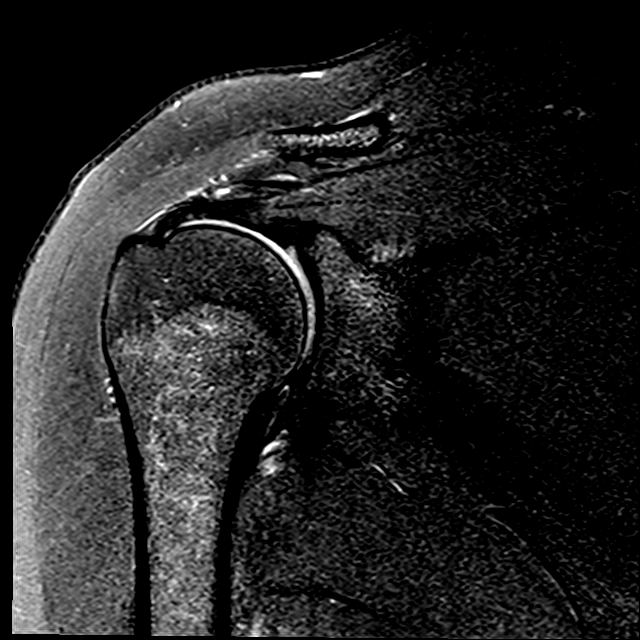
[im 15/18]
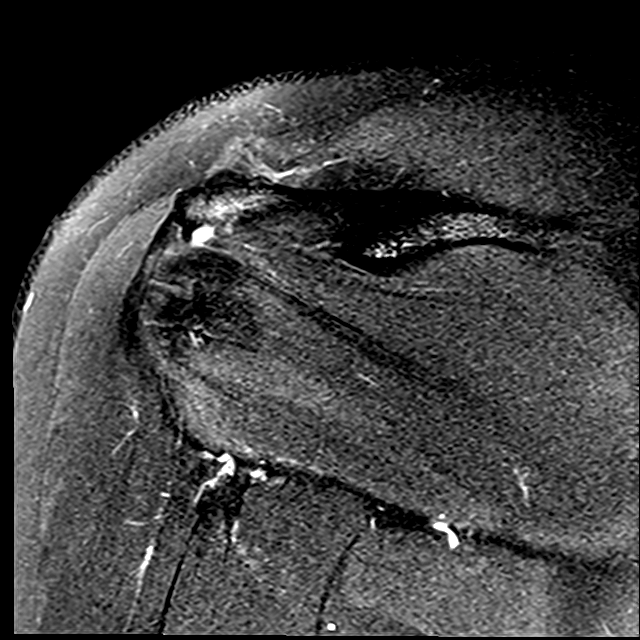

[15 of 40 positions shown; findings below may reference images not displayed]

FINDINGS: Rotator cuff: Mild to moderate rotator cuff tendinopathy/
tendinosis. No partial or full-thickness rotator cuff tear.

Muscles: Band of increased T2 signal intensity in the lateral
deltoid muscle could be a small muscle tear or muscle strain. The
rotator cuff muscles appear normal.

Biceps long head:  Intact

Acromioclavicular Joint: No significant degenerative changes. Type
1-2 acromion. Moderate lateral downsloping of the acromion.

Glenohumeral Joint: No degenerative changes or joint effusion.

Labrum:  Grossly normal.

Bones:  No acute bony findings

Other: Mild subacromial/subdeltoid bursitis.
IMPRESSION: 1. Mild to moderate rotator tendinopathy/tendinosis but no partial
or full-thickness tear.
2. Intact long head biceps tendon and glenoid labrum.
3. Possible small muscle tear involving the lateral deltoid.
4. Mild subacromial/subdeltoid bursitis.

## 2019-05-17 ENCOUNTER — Ambulatory Visit: Payer: Self-pay | Admitting: Physical Therapy

## 2022-05-12 ENCOUNTER — Other Ambulatory Visit: Payer: Self-pay | Admitting: Obstetrics and Gynecology

## 2022-05-12 DIAGNOSIS — Z1231 Encounter for screening mammogram for malignant neoplasm of breast: Secondary | ICD-10-CM

## 2022-07-06 ENCOUNTER — Ambulatory Visit
Admission: RE | Admit: 2022-07-06 | Discharge: 2022-07-06 | Disposition: A | Discharge status: Home / Self Care | Payer: Self-pay | Source: Ambulatory Visit | Attending: Obstetrics and Gynecology | Admitting: Obstetrics and Gynecology

## 2022-07-06 DIAGNOSIS — Z1231 Encounter for screening mammogram for malignant neoplasm of breast: Secondary | ICD-10-CM

## 2023-08-23 ENCOUNTER — Other Ambulatory Visit: Payer: Self-pay | Admitting: Obstetrics and Gynecology

## 2023-08-23 DIAGNOSIS — Z1231 Encounter for screening mammogram for malignant neoplasm of breast: Secondary | ICD-10-CM

## 2023-09-10 ENCOUNTER — Ambulatory Visit
Admission: RE | Admit: 2023-09-10 | Discharge: 2023-09-10 | Disposition: A | Discharge status: Home / Self Care | Payer: Self-pay | Source: Ambulatory Visit | Attending: Obstetrics and Gynecology | Admitting: Obstetrics and Gynecology

## 2023-09-10 DIAGNOSIS — Z1231 Encounter for screening mammogram for malignant neoplasm of breast: Secondary | ICD-10-CM
